# Patient Record
Sex: Male | Born: 1955 | Race: White | Hispanic: No | Marital: Married | Smoking: Never smoker
Health system: Southern US, Community
[De-identification: ages and names within clinical notes are randomized; demographics above are authoritative.]

## PROBLEM LIST (undated history)

## (undated) DIAGNOSIS — E785 Hyperlipidemia, unspecified: Secondary | ICD-10-CM

## (undated) DIAGNOSIS — E7800 Pure hypercholesterolemia, unspecified: Secondary | ICD-10-CM

## (undated) DIAGNOSIS — N319 Neuromuscular dysfunction of bladder, unspecified: Secondary | ICD-10-CM

## (undated) DIAGNOSIS — G8254 Quadriplegia, C5-C7 incomplete: Secondary | ICD-10-CM

## (undated) DIAGNOSIS — N521 Erectile dysfunction due to diseases classified elsewhere: Secondary | ICD-10-CM

## (undated) HISTORY — DX: Quadriplegia, C5-C7 incomplete: G82.54

## (undated) HISTORY — DX: Neuromuscular dysfunction of bladder, unspecified: N31.9

## (undated) HISTORY — PX: BACK SURGERY: SHX140

## (undated) HISTORY — DX: Erectile dysfunction due to diseases classified elsewhere: N52.1

## (undated) HISTORY — PX: LEG SURGERY: SHX1003

## (undated) HISTORY — DX: Hyperlipidemia, unspecified: E78.5

## (undated) HISTORY — DX: Pure hypercholesterolemia, unspecified: E78.00

## (undated) HISTORY — PX: NECK SURGERY: SHX720

## (undated) HISTORY — PX: LUNG REMOVAL, PARTIAL: SHX233

---

## 2001-07-18 DIAGNOSIS — S21332S Puncture wound without foreign body of left front wall of thorax with penetration into thoracic cavity, sequela: Secondary | ICD-10-CM

## 2001-07-18 HISTORY — PX: LUNG REMOVAL, PARTIAL: SHX233

## 2001-07-18 HISTORY — DX: Puncture wound without foreign body of left front wall of thorax with penetration into thoracic cavity, sequela: S21.332S

## 2004-02-06 ENCOUNTER — Encounter: Admission: RE | Admit: 2004-02-06 | Discharge: 2004-02-06 | Payer: Self-pay | Admitting: Family Medicine

## 2005-04-14 ENCOUNTER — Encounter: Admission: RE | Admit: 2005-04-14 | Discharge: 2005-05-27 | Payer: Self-pay | Admitting: Orthopedic Surgery

## 2007-01-18 ENCOUNTER — Ambulatory Visit (HOSPITAL_BASED_OUTPATIENT_CLINIC_OR_DEPARTMENT_OTHER): Admission: RE | Admit: 2007-01-18 | Discharge: 2007-01-18 | Payer: Self-pay | Admitting: Orthopedic Surgery

## 2007-07-19 DIAGNOSIS — G8254 Quadriplegia, C5-C7 incomplete: Secondary | ICD-10-CM

## 2007-07-19 HISTORY — DX: Quadriplegia, C5-C7 incomplete: G82.54

## 2008-02-21 ENCOUNTER — Inpatient Hospital Stay (HOSPITAL_COMMUNITY)
Admission: RE | Admit: 2008-02-21 | Discharge: 2008-03-09 | Payer: Self-pay | Admitting: Physical Medicine & Rehabilitation

## 2008-02-21 ENCOUNTER — Ambulatory Visit: Payer: Self-pay | Admitting: Physical Medicine & Rehabilitation

## 2008-03-09 ENCOUNTER — Inpatient Hospital Stay (HOSPITAL_COMMUNITY): Admission: EM | Admit: 2008-03-09 | Discharge: 2008-03-25 | Payer: Self-pay | Admitting: Internal Medicine

## 2008-03-09 ENCOUNTER — Ambulatory Visit: Payer: Self-pay | Admitting: Internal Medicine

## 2008-03-19 ENCOUNTER — Ambulatory Visit: Payer: Self-pay | Admitting: Infectious Diseases

## 2008-03-19 ENCOUNTER — Encounter (INDEPENDENT_AMBULATORY_CARE_PROVIDER_SITE_OTHER): Payer: Self-pay | Admitting: Internal Medicine

## 2008-03-20 ENCOUNTER — Ambulatory Visit: Payer: Self-pay | Admitting: Vascular Surgery

## 2008-03-20 ENCOUNTER — Encounter: Payer: Self-pay | Admitting: Infectious Diseases

## 2008-03-21 ENCOUNTER — Encounter (INDEPENDENT_AMBULATORY_CARE_PROVIDER_SITE_OTHER): Payer: Self-pay | Admitting: Internal Medicine

## 2008-05-24 ENCOUNTER — Emergency Department (HOSPITAL_COMMUNITY): Admission: EM | Admit: 2008-05-24 | Discharge: 2008-05-24 | Payer: Self-pay | Admitting: Emergency Medicine

## 2008-06-06 ENCOUNTER — Emergency Department (HOSPITAL_COMMUNITY): Admission: EM | Admit: 2008-06-06 | Discharge: 2008-06-06 | Payer: Self-pay | Admitting: Emergency Medicine

## 2008-06-12 ENCOUNTER — Emergency Department (HOSPITAL_COMMUNITY): Admission: EM | Admit: 2008-06-12 | Discharge: 2008-06-13 | Payer: Self-pay | Admitting: Emergency Medicine

## 2008-06-13 ENCOUNTER — Emergency Department (HOSPITAL_COMMUNITY): Admission: EM | Admit: 2008-06-13 | Discharge: 2008-06-13 | Payer: Self-pay | Admitting: Emergency Medicine

## 2008-06-13 ENCOUNTER — Ambulatory Visit (HOSPITAL_COMMUNITY): Admission: RE | Admit: 2008-06-13 | Discharge: 2008-06-13 | Payer: Self-pay | Admitting: Family Medicine

## 2009-01-25 IMAGING — CR DG LUMBAR SPINE COMPLETE 4+V
3 series · 3 of 3 positions shown · non-contrast
Comparison: CT of 03/10/2008

CLINICAL DATA: MRSA.  Rule out infection of hardware.

LUMBAR SPINE - COMPLETE 4+ VIEW

[t l-spine a.p.]
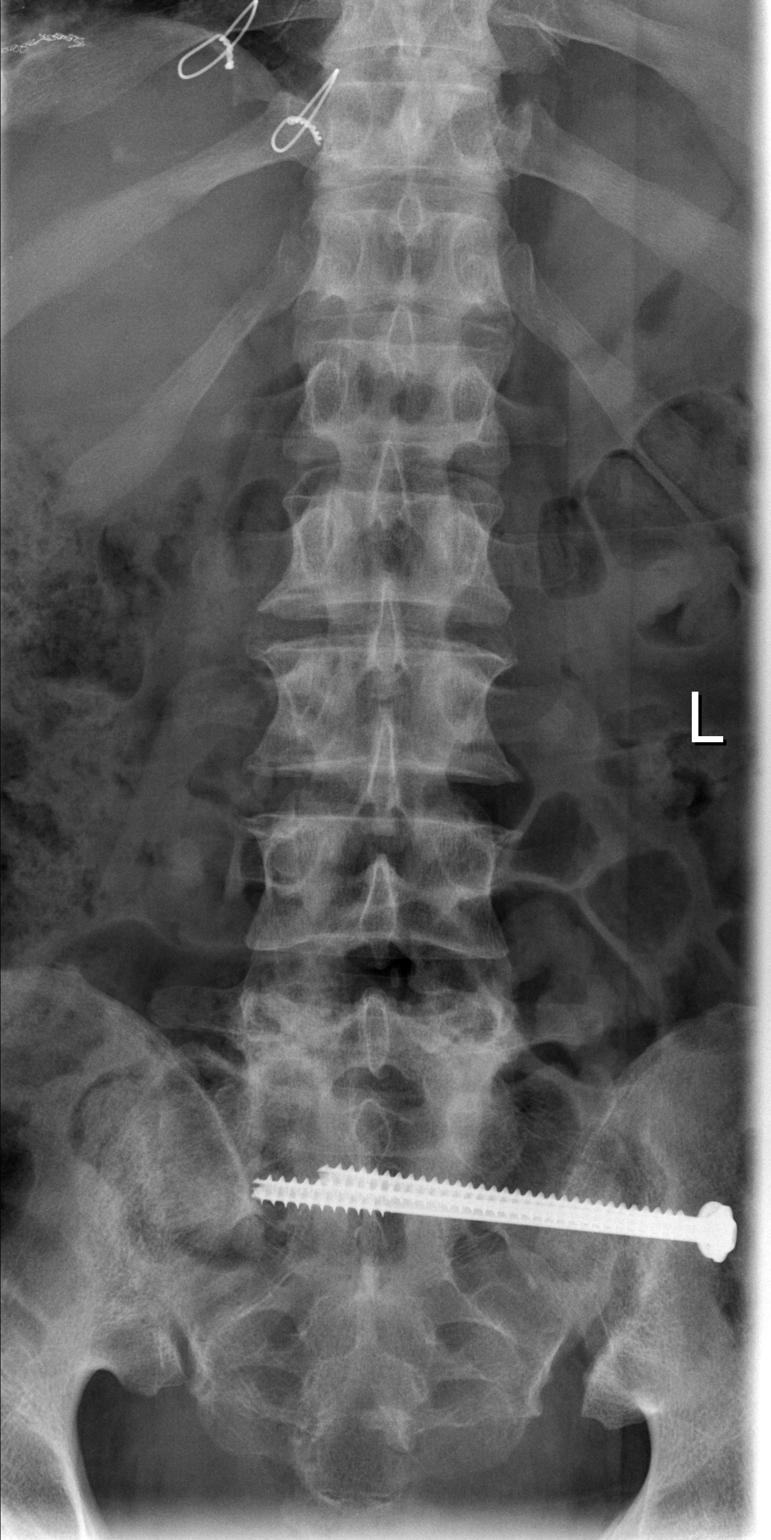

[t l-spine oblique exposure]
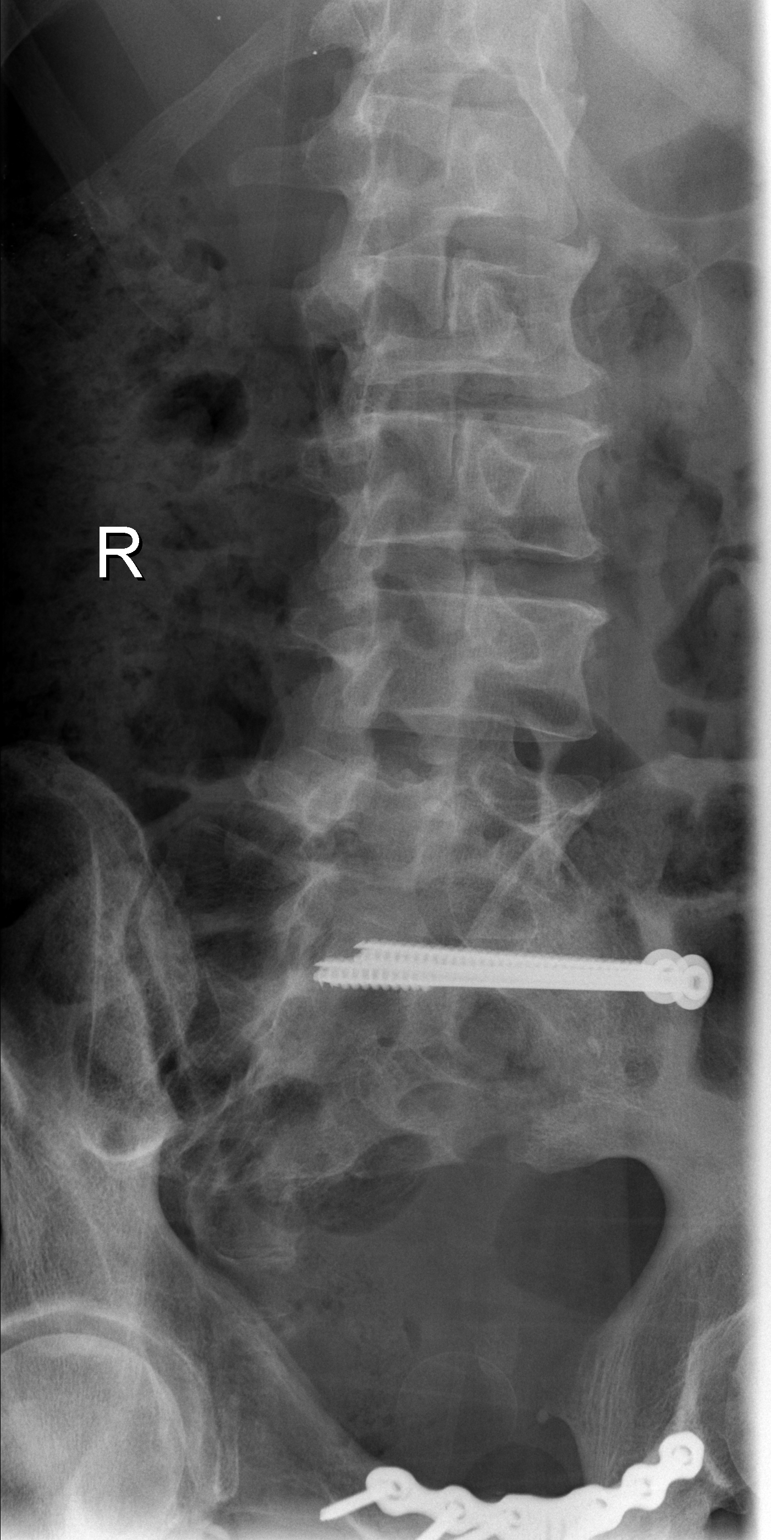

[w x table l-spine]
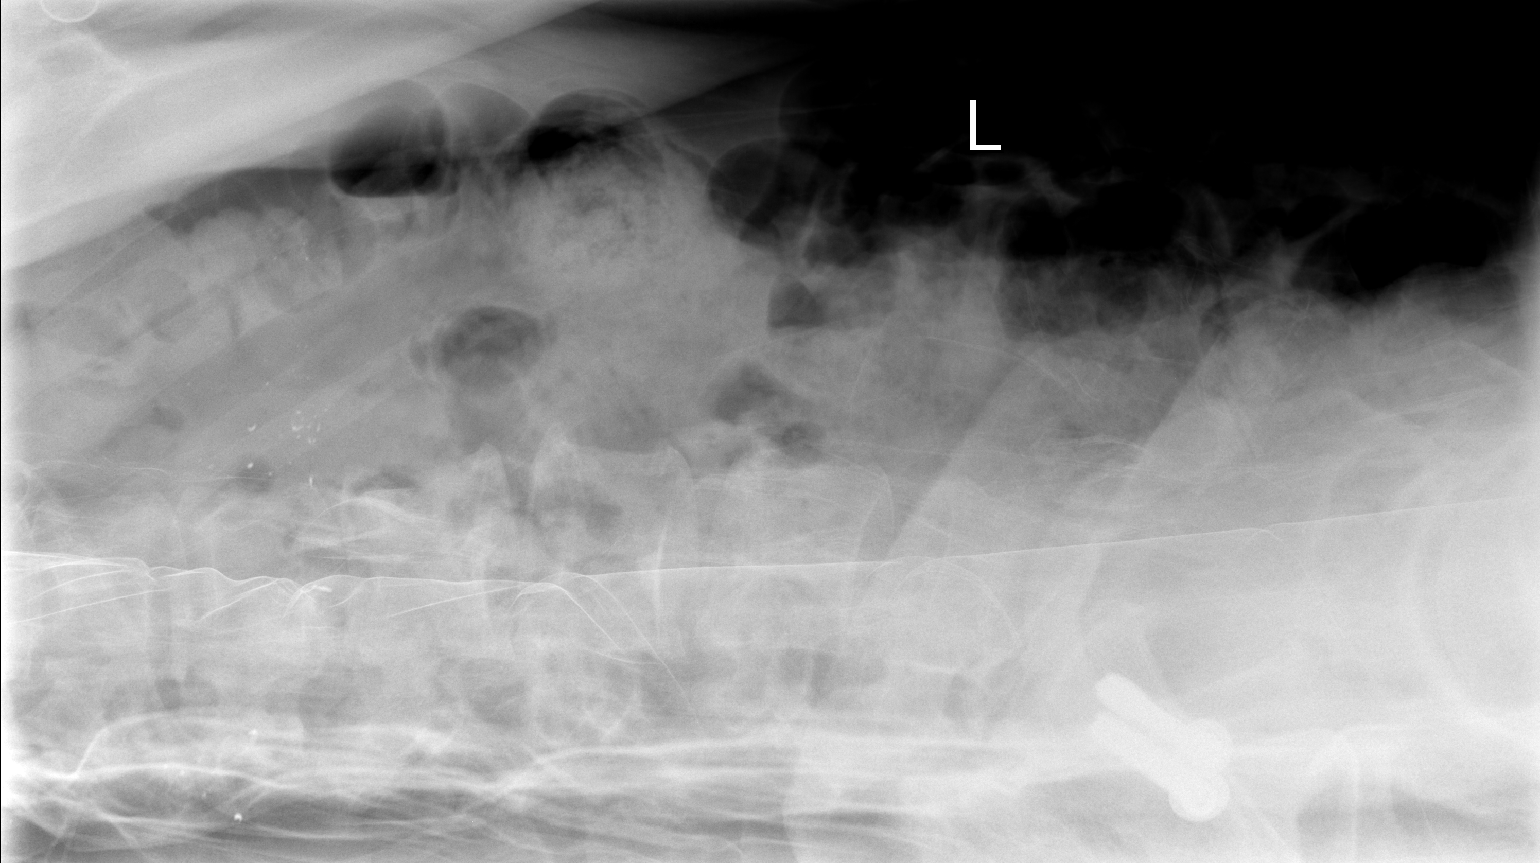

[3 of 3 positions shown; findings below may reference images not displayed]

FINDINGS: The AP view demonstrates screw fixation of the left
sacroiliac joint.  No gross lucency around the screw fixation.
Unremarkable bowel gas pattern. Single oblique view demonstrates
similar findings.  Attempted lateral view is nondiagnostic due to
cross table technique.  Extensive overlying artifact.
IMPRESSION: 1.  Nondiagnostic lateral view.  Frontal and single oblique view
demonstrating prior left sacroiliac joint fixation without acute
hardware complication.

## 2009-01-25 IMAGING — CR DG CERVICAL SPINE 2 OR 3 VIEWS
1 series · 1 of 1 positions shown · non-contrast
Comparison: None

CLINICAL DATA: MRSA.  Rule out osteomyelitis.

CERVICAL SPINE - 2-3 VIEW

[w x table l-spine]
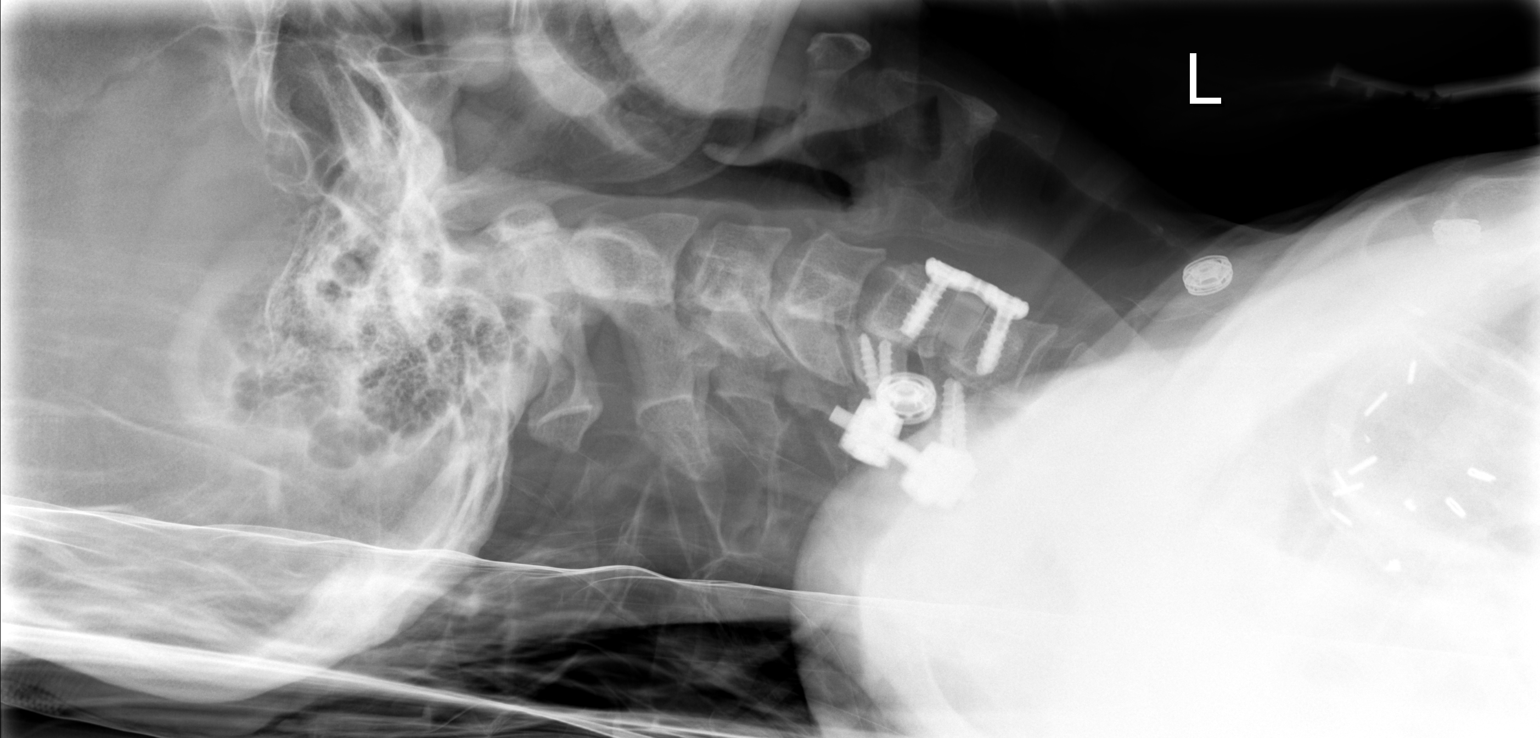

[1 of 1 positions shown; findings below may reference images not displayed]

FINDINGS: Limited 2 view exam.  The lateral view images through
bottom of C6.  Prevertebral soft tissues are normal.  The patient
is status post anterior C5-C6 fixation.  No hardware complication
identified.  Posterior fixation at these levels as well.
IMPRESSION: 1.  Status post C5-C6 fixation without evidence of hardware
complication.
2.  Limited two-view exam without acute abnormality through the C6
level.
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
## 2010-11-30 NOTE — Op Note (Signed)
NAME:  Steven Foster, Steven Foster             ACCOUNT NO.:  [id number]   MEDICAL RECORD NO.:  [id number]          PATIENT TYPE:  AMB   LOCATION:  DSC                          FACILITY:  [facility]   PHYSICIAN:  Katy Fitch. Sypher, M.D. DATE OF BIRTH:  February 18, 1956   DATE OF PROCEDURE:  01/18/2007  DATE OF DISCHARGE:                               OPERATIVE REPORT   PREOPERATIVE DIAGNOSIS:  Status post dog bite left long finger middle  phalangeal and distal interphalangeal segment 12/22/2006 with  development of subsequent cellulitis, septic arthritis and osteomyelitis  of middle phalanx.   POSTOPERATIVE DIAGNOSIS:  Status post dog bite left long finger middle  phalangeal and distal interphalangeal segment 12/22/2006 with  development of subsequent cellulitis, septic arthritis and osteomyelitis  of middle phalanx, confirmation of osteomyelitis of middle phalanx in  the region of the distal metaphysis and radial condyle at the distal  interphalangeal joint and septic arthritis of the distal interphalangeal  joint.   OPERATION:  Wide excision of a chronic granulating wound of left long  finger with curettage of the left long finger middle phalangeal dorsal  cortex for bone culture and debridement of a chronic granulating area of  osteomyelitis adjacent to a distal interphalangeal joint septic  arthritis followed by placement of a through-and-through vessel loop  drain and packing with Xeroflo.   OPERATING SURGEON:  Josephine Igo, M.D.   ASSISTANT:  Molly Maduro Dasnoit PA-C.   ANESTHESIA:  General by LMA, supervising anesthesiologist is Dr.  Sampson Goon.   INDICATIONS:  [name] is a 55 year old gentleman who  sustained a dog bite by his own pet on 12/22/2006.  He initially elected  not to seek medical care for nearly 1 week.  He subsequently was  evaluated at [facility] on [address].  His wound was incised,  cultured and irrigated.  He was started on Septra followed by  combination of  septra and doxycycline.   His wound has continued to granulate, drain and he has developed a  progressive the extensor lag of the left long finger DIP joint with  marked swelling and rubor.  He was ultimately referred for a upper  extremity orthopedic consult.  Clinical examination suggested an  insufficiency of his extensor tendon function and chronic granulation  tissue suggestive of an osteomyelitis deep to the extensor tendon.  Plain x-rays documented an area of lysis in the radial condyle and  radial distal metaphysis of the middle phalanx consistent with a chronic  osteomyelitis and narrowing of the DIP joint suggested chronic septic  arthritis.   We advised Steven Foster to proceed with a thorough debridement of his  finger to obtain both bone and joint cultures as well as to debride his  area of chronic osteomyelitis.   After informed consent he is brought to the operating room at this time.   PROCEDURE:  Steven Foster was brought to operating room and placed in  supine position on the operating table.   Following the induction of general anesthesia by LMA technique, the left  arm was prepped with Betadine soap and solution, sterilely draped.  A  pneumatic tourniquet  was applied proximal brachium.  Following  exsanguination of left arm with Esmarch bandage, arterial tourniquet was  inflated 220 mmHg.  Procedure commenced with a lazy S incision extending  the traumatic wound.  The entire area of granulation tissue was excised  en bloc.  The extensor tendon was gently retracted and elevated off of  the middle phalanx with a Therapist, nutritional.  A large area of chronic  granulation tissue was identified extending into the cortex of the  dorsal radial distal metaphysis of the middle phalanx.  The PIP joint  was entered and granulation tissues present within the joint.   The bone was curetted to a stable margin and all of the new bone  formation from the site of chronic  osteomyelitis and the areas of  cortical lysis were harvested for aerobic and anaerobic cultures.  The  joint was then and debrided with a micro rongeur followed by culture for  aerobic and anaerobic growth.  All visible pathologic tissue was  removed.  There was then irrigated with a dental needle and syringe with  sterile saline.  The joint was then drained with a through-and-through  vessel loop silicone drain followed by packing the wound open with  Xeroflo.  The finger was dressed with sterile gauze and Coban.  There no  apparent complications.   Steven Foster was awakened from his general anesthesia and transferred  recovery room with stable vital  signs.  He is advised to elevator his  hand throughout the weekend and will resume his oral doxycycline  medication.  We will withhold further antibiotics in addition to the  doxycycline until we have culture results.   He is provided prescription for Percocet 5 mg one p.o. for 6 hours and  pain 30 tablets without refill.      Katy Fitch Sypher, M.D.  Electronically Signed     RVS/MEDQ  D:  01/18/2007  T:  01/19/2007  Job:  [id number]
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
## 2011-04-15 LAB — CBC
HCT: 26.8 — ABNORMAL LOW
MCV: 94.1
Platelets: 658 — ABNORMAL HIGH
RBC: 2.84 — ABNORMAL LOW
WBC: 10
WBC: 11.9 — ABNORMAL HIGH

## 2011-04-15 LAB — DIFFERENTIAL
Basophils Absolute: 0
Basophils Relative: 0
Eosinophils Absolute: 0.3
Lymphocytes Relative: 14
Monocytes Absolute: 0.8
Monocytes Relative: 7
Neutrophils Relative %: 76

## 2011-04-15 LAB — CULTURE, BLOOD (ROUTINE X 2): Culture: NO GROWTH

## 2011-04-15 LAB — COMPREHENSIVE METABOLIC PANEL
ALT: 108 — ABNORMAL HIGH
AST: 61 — ABNORMAL HIGH
Albumin: 1.6 — ABNORMAL LOW
BUN: 9
CO2: 28
Calcium: 8.3 — ABNORMAL LOW
GFR calc Af Amer: >60
Glucose, Bld: 91
Potassium: 3.9
Sodium: 137
Total Protein: 5.5 — ABNORMAL LOW

## 2011-04-15 LAB — URINALYSIS, ROUTINE W REFLEX MICROSCOPIC
Ketones, ur: NEGATIVE
Nitrite: NEGATIVE
pH: 6.5

## 2011-04-15 LAB — URINE CULTURE: Special Requests: NEGATIVE

## 2011-04-15 LAB — URINE MICROSCOPIC-ADD ON

## 2011-04-19 LAB — URINALYSIS, ROUTINE W REFLEX MICROSCOPIC
Bilirubin Urine: NEGATIVE
Bilirubin Urine: NEGATIVE
Glucose, UA: NEGATIVE
Glucose, UA: NEGATIVE
Glucose, UA: NEGATIVE
Ketones, ur: 15 — AB
Ketones, ur: NEGATIVE
Protein, ur: 100 — AB
Specific Gravity, Urine: 1.012
Urobilinogen, UA: 0.2
pH: 7

## 2011-04-19 LAB — URINE CULTURE
Colony Count: >=100000
Culture: NO GROWTH

## 2011-04-19 LAB — URINE MICROSCOPIC-ADD ON

## 2011-04-19 LAB — POCT I-STAT, CHEM 8
BUN: 13
Calcium, Ion: 1.34 — ABNORMAL HIGH
Creatinine, Ser: 1
TCO2: 26

## 2011-04-20 LAB — CBC
HCT: 29.1 — ABNORMAL LOW
HCT: 29.4 — ABNORMAL LOW
HCT: 29.7 — ABNORMAL LOW
HCT: 29.9 — ABNORMAL LOW
HCT: 31.2 — ABNORMAL LOW
Hemoglobin: 10.5 — ABNORMAL LOW
Hemoglobin: 9.7 — ABNORMAL LOW
Hemoglobin: 9.8 — ABNORMAL LOW
Hemoglobin: 9.9 — ABNORMAL LOW
Hemoglobin: 9.9 — ABNORMAL LOW
MCHC: 33
MCHC: 33.4
MCHC: 34.1
MCHC: 34.2
MCV: 89.5
MCV: 90.4
MCV: 90.6
MCV: 91
Platelets: 540 — ABNORMAL HIGH
Platelets: 541 — ABNORMAL HIGH
Platelets: 596 — ABNORMAL HIGH
RBC: 3.2 — ABNORMAL LOW
RBC: 3.22 — ABNORMAL LOW
RBC: 3.25 — ABNORMAL LOW
RBC: 3.27 — ABNORMAL LOW
RBC: 3.34 — ABNORMAL LOW
RBC: 3.34 — ABNORMAL LOW
RBC: 3.43 — ABNORMAL LOW
RDW: 17.1 — ABNORMAL HIGH
RDW: 17.2 — ABNORMAL HIGH
RDW: 17.6 — ABNORMAL HIGH
RDW: 18.3 — ABNORMAL HIGH
WBC: 11 — ABNORMAL HIGH
WBC: 12.9 — ABNORMAL HIGH
WBC: 13.4 — ABNORMAL HIGH
WBC: 14.4 — ABNORMAL HIGH
WBC: 8.8

## 2011-04-20 LAB — URINALYSIS, ROUTINE W REFLEX MICROSCOPIC
Bilirubin Urine: NEGATIVE
Glucose, UA: NEGATIVE
Ketones, ur: NEGATIVE
Specific Gravity, Urine: 1.008
pH: 7

## 2011-04-20 LAB — DIFFERENTIAL
Basophils Absolute: 0.1
Basophils Relative: 1
Lymphocytes Relative: 11 — ABNORMAL LOW
Monocytes Absolute: 1.3 — ABNORMAL HIGH
Neutro Abs: 10.1 — ABNORMAL HIGH
Neutrophils Relative %: 75

## 2011-04-20 LAB — BASIC METABOLIC PANEL
BUN: 11
BUN: 7
Calcium: 8.6
Chloride: 100
Chloride: 102
Creatinine, Ser: 0.66
GFR calc Af Amer: >60
GFR calc non Af Amer: >60
GFR calc non Af Amer: >60
Potassium: 3.8
Sodium: 135

## 2011-04-20 LAB — CULTURE, BLOOD (ROUTINE X 2): Culture: NO GROWTH

## 2011-04-20 LAB — COMPREHENSIVE METABOLIC PANEL
AST: 23
BUN: 8
CO2: 28
Calcium: 8.7
Creatinine, Ser: 0.58
GFR calc Af Amer: >60
GFR calc non Af Amer: >60
Total Bilirubin: 0.6

## 2011-04-20 LAB — URINE MICROSCOPIC-ADD ON

## 2011-05-03 LAB — CULTURE, ROUTINE-ABSCESS

## 2011-05-03 LAB — ANAEROBIC CULTURE

## 2011-05-03 LAB — POCT HEMOGLOBIN-HEMACUE: Operator id: 12362

## 2012-02-23 ENCOUNTER — Emergency Department (HOSPITAL_COMMUNITY)
Admission: EM | Admit: 2012-02-23 | Discharge: 2012-02-23 | Disposition: A | Discharge status: Home / Self Care | Payer: Medicare Other | Attending: Emergency Medicine | Admitting: Emergency Medicine

## 2012-02-23 ENCOUNTER — Encounter (HOSPITAL_COMMUNITY): Payer: Self-pay | Admitting: Adult Health

## 2012-02-23 DIAGNOSIS — Y998 Other external cause status: Secondary | ICD-10-CM | POA: Insufficient documentation

## 2012-02-23 DIAGNOSIS — S058X9A Other injuries of unspecified eye and orbit, initial encounter: Secondary | ICD-10-CM | POA: Insufficient documentation

## 2012-02-23 DIAGNOSIS — S0501XA Injury of conjunctiva and corneal abrasion without foreign body, right eye, initial encounter: Secondary | ICD-10-CM

## 2012-02-23 DIAGNOSIS — Y9389 Activity, other specified: Secondary | ICD-10-CM | POA: Insufficient documentation

## 2012-02-23 DIAGNOSIS — T1590XA Foreign body on external eye, part unspecified, unspecified eye, initial encounter: Secondary | ICD-10-CM | POA: Insufficient documentation

## 2012-02-23 MED ORDER — ERYTHROMYCIN 5 MG/GM OP OINT
TOPICAL_OINTMENT | Freq: Once | OPHTHALMIC | Status: AC
  Administered 2012-02-23: 23:00:00 via OPHTHALMIC
  Filled 2012-02-23: qty 1

## 2012-02-23 MED ORDER — PROPARACAINE HCL 0.5 % OP SOLN
2.0000 [drp] | OPHTHALMIC | Status: AC
  Administered 2012-02-23: 2 [drp] via OPHTHALMIC
  Filled 2012-02-23: qty 15

## 2012-02-23 MED ORDER — TETRACAINE HCL 0.5 % OP SOLN
1.0000 [drp] | Freq: Once | OPHTHALMIC | Status: AC
  Administered 2012-02-23: 1 [drp] via OPHTHALMIC

## 2012-02-23 MED ORDER — TETRACAINE HCL 0.5 % OP SOLN
OPHTHALMIC | Status: AC
  Filled 2012-02-23: qty 2

## 2012-02-23 MED ORDER — FLUORESCEIN SODIUM 1 MG OP STRP
1.0000 | ORAL_STRIP | Freq: Once | OPHTHALMIC | Status: AC
  Administered 2012-02-23: 1 via OPHTHALMIC

## 2012-02-23 MED ORDER — TETANUS-DIPHTHERIA TOXOIDS TD 5-2 LFU IM INJ
0.5000 mL | INJECTION | Freq: Once | INTRAMUSCULAR | Status: AC
  Administered 2012-02-23: 0.5 mL via INTRAMUSCULAR
  Filled 2012-02-23 (×2): qty 0.5

## 2012-02-23 MED ORDER — FLUORESCEIN SODIUM 1 MG OP STRP
ORAL_STRIP | OPHTHALMIC | Status: AC
  Filled 2012-02-23: qty 2

## 2012-02-23 NOTE — ED Provider Notes (Signed)
Medical screening examination/treatment/procedure(s) were performed by non-physician practitioner and as supervising physician I was immediately available for consultation/collaboration.   Glynn Octave, MD 02/23/12 [phone]

## 2012-02-23 NOTE — ED Notes (Signed)
Reports wood in eye while doing a craft today at 1630. Right eye with redness. Eye was rinsed out by family. Pt denies blurred vision and states he can see normally.

## 2012-02-23 NOTE — ED Provider Notes (Signed)
History     CSN: [id number]  Arrival date & time 02/23/12  2050   First MD Initiated Contact with Patient 02/23/12 2111      Chief Complaint  Patient presents with  . Eye Pain    (Consider location/radiation/quality/duration/timing/severity/associated sxs/prior treatment) HPI Comments: Patient was working with wood today, sawing any inadvertently got sawdust in his eyes as he was not working with his safety glasses.  He now has tearing, and pain.  Slight redness to his right eye.  His wife states, that he flushed his eye out with water.  At home before coming in.  He does not report any visual changes  Patient is a 56 y.o. male presenting with eye pain. The history is provided by the patient.  Eye Pain This is a new problem. The current episode started today. Pertinent negatives include no chills, fever, headaches or nausea.    History reviewed. No pertinent past medical history.  Past Surgical History  Procedure Date  . Lung removal, partial     History reviewed. No pertinent family history.  History  Substance Use Topics  . Smoking status: Never Smoker   . Smokeless tobacco: Not on file  . Alcohol Use: No      Review of Systems  Constitutional: Negative for fever and chills.  HENT: Negative for rhinorrhea.   Eyes: Positive for photophobia, pain and redness. Negative for visual disturbance.  Gastrointestinal: Negative for nausea.  Skin: Negative for wound.  Neurological: Negative for dizziness and headaches.    Allergies  Penicillins  Home Medications   Current Outpatient Rx  Name Route Sig Dispense Refill  . BACLOFEN 10 MG PO TABS Oral Take 10 mg by mouth 2 (two) times daily.    Marland Kitchen DIAZEPAM 5 MG PO TABS Oral Take 5 mg by mouth every 6 (six) hours as needed. For sleep and muscle spasm    . ADULT MULTIVITAMIN W/MINERALS CH Oral Take 1 tablet by mouth daily.    Marland Kitchen NAPROXEN 250 MG PO TABS Oral Take 250 mg by mouth 2 (two) times daily as needed. For pain    .  SIMVASTATIN 40 MG PO TABS Oral Take 40 mg by mouth every evening.      BP 123/84  Pulse 70  Temp 98.3 F (36.8 C) (Oral)  Resp 14  SpO2 98%  Physical Exam  Constitutional: He appears well-developed.  HENT:  Head: Normocephalic.  Eyes: Pupils are equal, round, and reactive to light.  Slit lamp exam:      The left eye shows corneal abrasion and foreign body.       Small particles of sawdust visualized in the medial, lower, inner canthus.  I will have the site flushed, with 500 cc of normal saline before the application of erythromycin ointment for his corneal abrasion  Neck: Normal range of motion.  Cardiovascular: Normal rate.   Pulmonary/Chest: Effort normal.  Musculoskeletal: Normal range of motion.  Skin: Skin is warm.    ED Course  Procedures (including critical care time)  Labs Reviewed - No data to display No results found.   1. Corneal abrasion, right   2. Foreign body in eye       MDM   I was flushed, with 500 cc of normal saline foreign bodies removed he does have a small corneal abrasion at approximately 3:00 location.  I have given, this patient.  Erythromycin ointment to use 4 times a day, and a referral to the ophthalmologist.  He is to  call them first thing in the morning to set an evaluation.  Time        Arman Filter, NP 02/23/12 2228  Arman Filter, NP 02/23/12 2230

## 2012-02-23 NOTE — Discharge Instructions (Signed)
Corneal Abrasion The cornea is the clear covering at the front and center of the eye. It is a thin tissue made up of layers. The top layer is the most sensitive layer. A corneal abrasion happens if this layer is scratched or an injury causes it to come off.  HOME CARE  You may be given drops or a medicated cream. Use the medicine as told by your doctor.   A pressure patch may be put over the eye. If this is done, follow your doctor's instructions for when to remove the patch. Do not drive or use machines while the eye patch is on. Judging distances is hard to do with a patch on.   See your doctor for a follow-up exam if you are told to do so.  GET HELP RIGHT AWAY IF:   The pain is getting worse or is very bad.   The eye is very sensitive to light.   Any liquid comes out of the injured eye after treatment.   Your vision suddenly gets worse.   You have a sudden loss of vision or blindness.  MAKE SURE YOU:   Understand these instructions.   Will watch your condition.   Will get help right away if you are not doing well or get worse.  Document Released: 12/21/2007 Document Revised: 06/23/2011 Document Reviewed: 12/21/2007 [facility] Patient Information 2012 [place], [place]. Please uses a the supplied I ointment as follows one quarter in stripe inside the lower lid 4 times a day.  Please call the ophthalmologist in the morning to set an evaluation time for followup

## 2014-04-11 ENCOUNTER — Encounter (HOSPITAL_COMMUNITY): Payer: Self-pay | Admitting: Emergency Medicine

## 2014-04-11 ENCOUNTER — Emergency Department (HOSPITAL_COMMUNITY)
Admission: EM | Admit: 2014-04-11 | Discharge: 2014-04-11 | Disposition: A | Discharge status: Home / Self Care | Payer: Medicare Other | Attending: Emergency Medicine | Admitting: Emergency Medicine

## 2014-04-11 DIAGNOSIS — Z880 Allergy status to penicillin: Secondary | ICD-10-CM | POA: Diagnosis not present

## 2014-04-11 DIAGNOSIS — R58 Hemorrhage, not elsewhere classified: Secondary | ICD-10-CM | POA: Insufficient documentation

## 2014-04-11 DIAGNOSIS — Z79899 Other long term (current) drug therapy: Secondary | ICD-10-CM | POA: Diagnosis not present

## 2014-04-11 DIAGNOSIS — S81801A Unspecified open wound, right lower leg, initial encounter: Secondary | ICD-10-CM

## 2014-04-11 NOTE — ED Notes (Signed)
Pressure bandage had the  Bleeding controlled until i unwrapped it.  He has a small area over a varicose vein that iis bleeding still not spurting.  Pressure bandage re applied.  Watch for bleeding through

## 2014-04-11 NOTE — ED Notes (Signed)
The pt weas scratching his rt lower leg when he scratched a scab off and there was  A lot of blood according to his wife.  Bleeding controlled at present.  He has varicose veins.  Takes no blood thinners

## 2014-04-11 NOTE — Discharge Instructions (Signed)
Keep compressive dressing on for 24 hours. If bleeding recurs re-dress the wound. If you were given medicines take as directed.  If you are on coumadin or contraceptives realize their levels and effectiveness is altered by many different medicines.  If you have any reaction (rash, tongues swelling, other) to the medicines stop taking and see a physician.   Please follow up as directed and return to the ER or see a physician for new or worsening symptoms.  Thank you. Filed Vitals:   04/11/14 0016 04/11/14 0155 04/11/14 0200 04/11/14 0215  BP: 139/77 149/94 127/79 148/79  Pulse: 59 61 59 60  Temp: 98.2 F (36.8 C) 97.6 F (36.4 C)    TempSrc: Oral Oral    Resp: 22 18  14   Height: 5\' 8"  (1.727 m)     Weight: 176 lb (79.833 kg)     SpO2: 98% 98% 98% 97%

## 2014-04-11 NOTE — ED Provider Notes (Signed)
CSN: [id number]     Arrival date & time 04/11/14  0004 History   First MD Initiated Contact with Patient 04/11/14 0143     Chief Complaint  Patient presents with  . bleedikng from rt lower leg      (Consider location/radiation/quality/duration/timing/severity/associated sxs/prior Treatment) HPI Comments: 58 year old male with no significant medical history except for cholesterol presents with right lateral lower leg bleeding since scratching it this evening. Patient had small pool of blood besides light. No syncope or lightheadedness. No blood thinners. No injuries.   History reviewed. No pertinent past medical history. Past Surgical History  Procedure Laterality Date  . Lung removal, partial     No family history on file. History  Substance Use Topics  . Smoking status: Never Smoker   . Smokeless tobacco: Not on file  . Alcohol Use: No    Review of Systems  Constitutional: Negative for fever and chills.  Gastrointestinal: Negative for vomiting and abdominal pain.  Genitourinary: Negative for dysuria and flank pain.  Musculoskeletal: Negative for back pain, neck pain and neck stiffness.  Skin: Positive for wound. Negative for rash.  Neurological: Negative for light-headedness and headaches.      Allergies  Penicillins  Home Medications   Prior to Admission medications   Medication Sig Start Date End Date Taking? Authorizing Provider  baclofen (LIORESAL) 10 MG tablet Take 10 mg by mouth 2 (two) times daily.   Yes Historical Provider, MD  Multiple Vitamin (MULTIVITAMIN WITH MINERALS) TABS Take 1 tablet by mouth daily.   Yes Historical Provider, MD  Omega-3 Fatty Acids (FISH OIL PO) Take 1 capsule by mouth daily.   Yes Historical Provider, MD  simvastatin (ZOCOR) 40 MG tablet Take 40 mg by mouth every evening.   Yes Historical Provider, MD   BP 148/79  Pulse 60  Temp(Src) 97.6 F (36.4 C) (Oral)  Resp 14  Ht 5\' 8"  (1.727 m)  Wt 176 lb (79.833 kg)  BMI 26.77 kg/m2   SpO2 97% Physical Exam  Nursing note and vitals reviewed. Constitutional: He appears well-developed and well-nourished. No distress.  HENT:  Head: Normocephalic and atraumatic.  Eyes: Conjunctivae are normal.  Pulmonary/Chest: Effort normal.  Musculoskeletal: He exhibits no edema and no tenderness.  Neurological: He is alert.  Skin: Skin is warm.  Right lower lateral leg, dressing removed and patient had 2 mm site of previous bleeding which is currently controlled. No sign of infection. No pulsatile bleeding. Neurovascularly intact right leg  Psychiatric: He has a normal mood and affect.    ED Course  Procedures (including critical care time) Labs Review Labs Reviewed - No data to display  Imaging Review No results found.   EKG Interpretation None      MDM   Final diagnoses:  Leg wound, right, initial encounter   bleeding varicose vein  Well-appearing patient with bleeding controlled in ER. Compressive dressing with a quick clot bandage placed. Supportive care discussed.  No bleeding in ER.  Results and differential diagnosis were discussed with the patient/parent/guardian. Close follow up outpatient was discussed, comfortable with the plan.   Medications - No data to display  Filed Vitals:   04/11/14 0016 04/11/14 0155 04/11/14 0200 04/11/14 0215  BP: 139/77 149/94 127/79 148/79  Pulse: 59 61 59 60  Temp: 98.2 F (36.8 C) 97.6 F (36.4 C)    TempSrc: Oral Oral    Resp: 22 18  14   Height: 5\' 8"  (1.727 m)     Weight: 176 lb (  79.833 kg)     SpO2: 98% 98% 98% 97%    Final diagnoses:  Leg wound, right, initial encounter        Mariea Clonts, MD 04/11/14 [phone]

## 2014-04-11 NOTE — ED Notes (Signed)
Dr. Reather Converse at bedside. Applied quick clot and applied new dressing.

## 2014-11-27 ENCOUNTER — Ambulatory Visit (INDEPENDENT_AMBULATORY_CARE_PROVIDER_SITE_OTHER): Payer: Medicare Other | Admitting: Neurology

## 2014-11-27 ENCOUNTER — Encounter: Payer: Self-pay | Admitting: Neurology

## 2014-11-27 VITALS — BP 122/76 | HR 72 | Resp 18 | Ht 68.0 in | Wt 176.0 lb

## 2014-11-27 DIAGNOSIS — S14109S Unspecified injury at unspecified level of cervical spinal cord, sequela: Secondary | ICD-10-CM

## 2014-11-27 DIAGNOSIS — G8250 Quadriplegia, unspecified: Secondary | ICD-10-CM

## 2014-11-27 DIAGNOSIS — G8389 Other specified paralytic syndromes: Secondary | ICD-10-CM | POA: Diagnosis not present

## 2014-11-27 DIAGNOSIS — M62838 Other muscle spasm: Secondary | ICD-10-CM | POA: Diagnosis not present

## 2014-11-27 DIAGNOSIS — S14105S Unspecified injury at C5 level of cervical spinal cord, sequela: Secondary | ICD-10-CM

## 2014-11-27 MED ORDER — BACLOFEN 10 MG PO TABS: 20.0000 mg | ORAL_TABLET | Freq: Four times a day (QID) | ORAL | Status: DC
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
## 2015-03-19 ENCOUNTER — Telehealth: Payer: Self-pay

## 2015-03-19 NOTE — Telephone Encounter (Signed)
I spoke to patient and I asked if he could move his appt to a later time due to template change. Patient was able to move time to 1:30.

## 2015-03-30 ENCOUNTER — Ambulatory Visit (INDEPENDENT_AMBULATORY_CARE_PROVIDER_SITE_OTHER): Payer: Medicare Other | Admitting: Neurology

## 2015-03-30 ENCOUNTER — Ambulatory Visit: Payer: Medicare Other | Admitting: Neurology

## 2015-03-30 ENCOUNTER — Encounter: Payer: Self-pay | Admitting: Neurology

## 2015-03-30 VITALS — BP 102/68 | HR 62 | Resp 16 | Ht 68.0 in | Wt 178.0 lb

## 2015-03-30 DIAGNOSIS — S14105S Unspecified injury at C5 level of cervical spinal cord, sequela: Secondary | ICD-10-CM

## 2015-03-30 DIAGNOSIS — S14109S Unspecified injury at unspecified level of cervical spinal cord, sequela: Secondary | ICD-10-CM | POA: Diagnosis not present

## 2015-03-30 DIAGNOSIS — G8389 Other specified paralytic syndromes: Secondary | ICD-10-CM

## 2015-03-30 DIAGNOSIS — G8250 Quadriplegia, unspecified: Secondary | ICD-10-CM

## 2015-03-30 MED ORDER — BACLOFEN 20 MG PO TABS: 20.0000 mg | ORAL_TABLET | Freq: Four times a day (QID) | ORAL | Status: DC
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
## 2015-03-30 NOTE — Patient Instructions (Addendum)
We will continue with Baclofen 20 mg 4 times a day and I changed the pills to 20 mg strength, so you will take 1 pill 4 times a day!  Try to stay active. Try to drink enough water.   If it has been more than 3 years since your eye check, please have your eyes checked routine.   Monitor your left leg swelling.   Follow up in 6 months.

## 2015-06-08 ENCOUNTER — Other Ambulatory Visit: Payer: Self-pay | Admitting: Family Medicine

## 2015-06-08 ENCOUNTER — Ambulatory Visit
Admission: RE | Admit: 2015-06-08 | Discharge: 2015-06-08 | Disposition: A | Discharge status: Home / Self Care | Payer: Medicare Other | Source: Ambulatory Visit | Attending: Family Medicine | Admitting: Family Medicine

## 2015-06-08 DIAGNOSIS — M79662 Pain in left lower leg: Secondary | ICD-10-CM

## 2015-09-08 DIAGNOSIS — F322 Major depressive disorder, single episode, severe without psychotic features: Secondary | ICD-10-CM | POA: Diagnosis not present

## 2015-09-28 ENCOUNTER — Telehealth: Payer: Self-pay

## 2015-09-28 ENCOUNTER — Encounter: Payer: Self-pay | Admitting: Nurse Practitioner

## 2015-09-28 ENCOUNTER — Ambulatory Visit (INDEPENDENT_AMBULATORY_CARE_PROVIDER_SITE_OTHER): Payer: Commercial Managed Care - HMO | Admitting: Nurse Practitioner

## 2015-09-28 VITALS — BP 112/75 | HR 62 | Ht 68.0 in | Wt 181.6 lb

## 2015-09-28 DIAGNOSIS — S14109S Unspecified injury at unspecified level of cervical spinal cord, sequela: Secondary | ICD-10-CM | POA: Insufficient documentation

## 2015-09-28 DIAGNOSIS — G8389 Other specified paralytic syndromes: Secondary | ICD-10-CM | POA: Diagnosis not present

## 2015-09-28 DIAGNOSIS — IMO0002 Reserved for concepts with insufficient information to code with codable children: Secondary | ICD-10-CM | POA: Insufficient documentation

## 2015-09-28 NOTE — Patient Instructions (Signed)
Continue baclofen at current dose does not need refills Follow-up in 6 months

## 2015-09-28 NOTE — Telephone Encounter (Signed)
Patient did not show to appt today  
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
## 2015-09-29 ENCOUNTER — Ambulatory Visit: Payer: Commercial Managed Care - HMO | Admitting: Neurology

## 2015-10-06 DIAGNOSIS — F322 Major depressive disorder, single episode, severe without psychotic features: Secondary | ICD-10-CM | POA: Diagnosis not present

## 2015-10-15 DIAGNOSIS — B349 Viral infection, unspecified: Secondary | ICD-10-CM | POA: Diagnosis not present

## 2016-01-13 DIAGNOSIS — N3020 Other chronic cystitis without hematuria: Secondary | ICD-10-CM | POA: Diagnosis not present

## 2016-01-13 DIAGNOSIS — N3941 Urge incontinence: Secondary | ICD-10-CM | POA: Diagnosis not present

## 2016-01-13 DIAGNOSIS — N3080 Other cystitis without hematuria: Secondary | ICD-10-CM | POA: Diagnosis not present

## 2016-01-13 DIAGNOSIS — R339 Retention of urine, unspecified: Secondary | ICD-10-CM | POA: Diagnosis not present

## 2016-01-13 DIAGNOSIS — N5201 Erectile dysfunction due to arterial insufficiency: Secondary | ICD-10-CM | POA: Diagnosis not present

## 2016-01-13 DIAGNOSIS — G8250 Quadriplegia, unspecified: Secondary | ICD-10-CM | POA: Diagnosis not present

## 2016-02-08 DIAGNOSIS — R42 Dizziness and giddiness: Secondary | ICD-10-CM | POA: Diagnosis not present

## 2016-02-23 ENCOUNTER — Encounter: Payer: Self-pay | Admitting: Neurology

## 2016-03-28 ENCOUNTER — Ambulatory Visit: Payer: Commercial Managed Care - HMO | Admitting: Neurology

## 2016-04-05 ENCOUNTER — Ambulatory Visit: Payer: Commercial Managed Care - HMO | Admitting: Neurology

## 2016-05-03 ENCOUNTER — Other Ambulatory Visit: Payer: Self-pay | Admitting: Neurology

## 2016-05-03 DIAGNOSIS — S14105S Unspecified injury at C5 level of cervical spinal cord, sequela: Secondary | ICD-10-CM

## 2016-05-03 DIAGNOSIS — G8250 Quadriplegia, unspecified: Secondary | ICD-10-CM

## 2016-06-13 ENCOUNTER — Encounter: Payer: Self-pay | Admitting: Neurology

## 2016-06-13 ENCOUNTER — Ambulatory Visit (INDEPENDENT_AMBULATORY_CARE_PROVIDER_SITE_OTHER): Payer: Medicare Other | Admitting: Neurology

## 2016-06-13 DIAGNOSIS — G8250 Quadriplegia, unspecified: Secondary | ICD-10-CM | POA: Diagnosis not present

## 2016-06-13 DIAGNOSIS — S14105S Unspecified injury at C5 level of cervical spinal cord, sequela: Secondary | ICD-10-CM | POA: Diagnosis not present

## 2016-06-13 MED ORDER — BACLOFEN 20 MG PO TABS: 20.0000 mg | ORAL_TABLET | Freq: Four times a day (QID) | ORAL | 3 refills | Status: DC
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
## 2016-07-22 ENCOUNTER — Other Ambulatory Visit: Payer: Self-pay | Admitting: Neurology

## 2016-07-22 NOTE — Telephone Encounter (Signed)
Pt requesting refill request for baclofen (LIORESAL) 20 MG tablet.

## 2016-07-22 NOTE — Telephone Encounter (Signed)
Please advise pt, that 90 day Rx was just done about a month ago to [facility].

## 2016-07-25 NOTE — Telephone Encounter (Signed)
I spoke to Steven Foster, he was not aware that it was sent in. I advised him of which pharmacy and asked him to call back if he had any further trouble.

## 2016-09-02 ENCOUNTER — Other Ambulatory Visit: Payer: Self-pay | Admitting: Family Medicine

## 2016-09-02 DIAGNOSIS — R221 Localized swelling, mass and lump, neck: Secondary | ICD-10-CM

## 2016-09-05 ENCOUNTER — Ambulatory Visit
Admission: RE | Admit: 2016-09-05 | Discharge: 2016-09-05 | Disposition: A | Discharge status: Home / Self Care | Payer: Medicare Other | Source: Ambulatory Visit | Attending: Family Medicine | Admitting: Family Medicine

## 2016-09-05 DIAGNOSIS — R221 Localized swelling, mass and lump, neck: Secondary | ICD-10-CM

## 2017-06-08 ENCOUNTER — Other Ambulatory Visit: Payer: Self-pay

## 2017-06-08 ENCOUNTER — Emergency Department (HOSPITAL_COMMUNITY)
Admission: EM | Admit: 2017-06-08 | Discharge: 2017-06-08 | Disposition: A | Discharge status: Home / Self Care | Payer: Medicare Other | Attending: Emergency Medicine | Admitting: Emergency Medicine

## 2017-06-08 ENCOUNTER — Encounter (HOSPITAL_COMMUNITY): Payer: Self-pay | Admitting: Emergency Medicine

## 2017-06-08 DIAGNOSIS — Z79899 Other long term (current) drug therapy: Secondary | ICD-10-CM | POA: Diagnosis not present

## 2017-06-08 DIAGNOSIS — R310 Gross hematuria: Secondary | ICD-10-CM | POA: Diagnosis present

## 2017-06-08 DIAGNOSIS — G8253 Quadriplegia, C5-C7 complete: Secondary | ICD-10-CM | POA: Diagnosis not present

## 2017-06-08 DIAGNOSIS — N3091 Cystitis, unspecified with hematuria: Secondary | ICD-10-CM | POA: Insufficient documentation

## 2017-06-08 LAB — URINALYSIS, ROUTINE W REFLEX MICROSCOPIC: SQUAMOUS EPITHELIAL / LPF: NONE SEEN

## 2017-06-08 MED ORDER — CEPHALEXIN 250 MG PO CAPS
500.0000 mg | ORAL_CAPSULE | Freq: Once | ORAL | Status: AC
  Administered 2017-06-08: 500 mg via ORAL
  Filled 2017-06-08: qty 2

## 2017-06-08 MED ORDER — CEPHALEXIN 500 MG PO CAPS: 500.0000 mg | ORAL_CAPSULE | Freq: Four times a day (QID) | ORAL | 0 refills | Status: DC

## 2017-06-08 NOTE — ED Provider Notes (Signed)
[facility] Provider Note   CSN: [id number] Arrival date & time: 06/08/17  2542     History   Chief Complaint Chief Complaint  Patient presents with  . Hematuria    HPI Steven Foster is a 61 y.o. male.  Patient indicates tried to self cath/in and out this AM, and then noted blood in urine. At baseline he states most of the time he urinates on own, but that occasionally if he has trouble he will self cath. Denies any recent dysuria.  Denies abdominal or flank pain. No fever or chills. No odor to urine. States felt fine/at baseline before trying to cath and then noting the blood. No other abnormal bleeding or bruising. No anticoag use.   The history is provided by the patient.  Hematuria  Pertinent negatives include no chest pain, no abdominal pain, no headaches and no shortness of breath.    Past Medical History:  Diagnosis Date  . Erectile dysfunction due to diseases classified elsewhere   . Hypercholesteremia   . Neuromuscular dysfunction of bladder   . Quadriplegia, C5-C7, incomplete [facility])     Patient Active Problem List   Diagnosis Date Noted  . 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 Unspecified injury at unspecified level of cervical spinal cord, sequela ([place]) 09/28/2015    Past Surgical History:  Procedure Laterality Date  . BACK SURGERY    . LEG SURGERY    . LUNG REMOVAL, PARTIAL    . NECK SURGERY         Home Medications    Prior to Admission medications   Medication Sig Start Date End Date Taking? Authorizing Provider  baclofen (LIORESAL) 20 MG tablet Take 1 tablet (20 mg total) by mouth 4 (four) times daily. 06/13/16   [name], MD  buPROPion (WELLBUTRIN XL) 150 MG 24 hr tablet  09/11/15   [provider]  diphenhydrAMINE (SOMINEX) 25 MG tablet Take 25 mg by mouth at bedtime as needed for sleep.    [provider]  fluticasone (FLONASE) 50 MCG/ACT nasal spray Place into both nostrils  daily.    [provider]  folic acid (FOLVITE) 706 MCG tablet Take 400 mcg by mouth daily.    [provider]  furosemide (LASIX) 20 MG tablet Take 20 mg by mouth.    [provider]  HYDROcodone-acetaminophen (NORCO/VICODIN) 5-325 MG per tablet Take 1 tablet by mouth every 6 (six) hours as needed for moderate pain.    [provider]  naproxen sodium (ANAPROX) 220 MG tablet Take 220 mg by mouth 3 (three) times daily with meals.    [provider]  Omega-3 Fatty Acids (FISH OIL PO) Take by mouth.    [provider]  simvastatin (ZOCOR) 40 MG tablet Take 40 mg by mouth every evening.    [provider]    Family History Family History  Problem Relation Age of Onset  . Heart disease Mother     Social History Social History   Tobacco Use  . Smoking status: Never Smoker  Substance Use Topics  . Alcohol use: No    Alcohol/week: 0.0 oz  . Drug use: No     Allergies   Penicillins   Review of Systems Review of Systems  Constitutional: Negative for fever.  HENT: Negative for nosebleeds.   Eyes: Negative for redness.  Respiratory: Negative for shortness of breath.   Cardiovascular: Negative for chest pain.  Gastrointestinal: Negative for abdominal pain and  blood in stool.  Genitourinary: Positive for hematuria. Negative for dysuria and flank pain.  Musculoskeletal: Negative for back pain.  Skin: Negative for rash.  Neurological: Negative for headaches.  Hematological: Does not bruise/bleed easily.  Psychiatric/Behavioral: Negative for confusion.     Physical Exam Updated Vital Signs BP 107/79   Pulse 73   Temp 98 F (36.7 C) (Oral)   Resp 18   Ht 1.727 m (5\' 8" )   Wt 80.7 kg (178 lb)   SpO2 97%   BMI 27.06 kg/m   Physical Exam  Constitutional: He appears well-developed and well-nourished. No distress.  HENT:  Head: Atraumatic.  Eyes: Conjunctivae are normal.  Neck: Neck supple. No tracheal  deviation present.  Cardiovascular: Normal rate.  Pulmonary/Chest: Effort normal. No accessory muscle usage. No respiratory distress.  Abdominal: Soft. He exhibits no distension. There is no tenderness.  Genitourinary:  Genitourinary Comments: Normal external gu exam.   Musculoskeletal: He exhibits no edema.  Neurological: He is alert.  Skin: Skin is warm and dry. No rash noted. He is not diaphoretic.  Psychiatric: He has a normal mood and affect.  Nursing note and vitals reviewed.    ED Treatments / Results  Labs (all labs ordered are listed, but only abnormal results are displayed) Results for orders placed or performed during the hospital encounter of 06/13/08  Urinalysis, Routine w reflex microscopic  Result Value Ref Range   Color, Urine RED BIOCHEMICALS MAY BE AFFECTED BY COLOR (A)    APPearance CLOUDY (A)    Specific Gravity, Urine 1.012    pH 7.0    Glucose, UA NEGATIVE    Hgb urine dipstick LARGE (A)    Bilirubin Urine NEGATIVE    Ketones, ur NEGATIVE    Protein, ur NEGATIVE    Urobilinogen, UA 0.2    Nitrite NEGATIVE    Leukocytes, UA MODERATE (A)   Urine microscopic-add on  Result Value Ref Range   Squamous Epithelial / LPF RARE    WBC, UA 3-6    RBC / HPF TOO NUMEROUS TO COUNT    Bacteria, UA MANY (A)    Crystals CA OXALATE CRYSTALS (A)      EKG  EKG Interpretation None       Radiology No results found.  Procedures Procedures (including critical care time)  Medications Ordered in ED Medications - No data to display   Initial Impression / Assessment and Plan / ED Course  I have reviewed the triage vital signs and the nursing notes.  Pertinent labs & imaging results that were available during my care of the patient were reviewed by me and considered in my medical decision making (see chart for details).  Bladder scan.  Pt attempts to void on own.  Reviewed nursing notes and prior charts for additional history.   Keflex po.  Bladder  scan 200 cc.  Pt subsequent was able to void approx 200 cc urine.  abd soft nt.    Final Clinical Impressions(s) / ED Diagnoses   Final diagnoses:  None    ED Discharge Orders    None       Lajean Saver, MD 06/08/17 1041

## 2017-06-08 NOTE — Discharge Instructions (Signed)
It was our pleasure to provide your ER care today - we hope that you feel better.  Take antibiotic as prescribed.  Drink plenty of fluids.  Follow up with your urologist in the next few days if symptoms fail to improve/resolve.  Return to ER if worse, new symptoms, high fevers, severe pain, unable to void, other concern.

## 2017-06-08 NOTE — ED Triage Notes (Signed)
Pt states sometimes he has to self cath. States he felt urge to void, and self catheterized himself earlier today and felt resistance, so he "jabbed" the catheter through and now is experiencing hematuria. Pt states "I had some light chunks of skin coming out too."

## 2017-06-08 NOTE — ED Notes (Signed)
Pt urinated 200cc red urine. Grape size clot noted post void. No pain.

## 2017-06-08 NOTE — ED Notes (Signed)
262ml of Urine noted on bladder scan

## 2017-06-09 LAB — URINE CULTURE

## 2017-06-14 ENCOUNTER — Ambulatory Visit: Payer: Medicare Other | Admitting: Neurology

## 2017-06-15 ENCOUNTER — Encounter: Payer: Self-pay | Admitting: Neurology

## 2017-07-06 ENCOUNTER — Ambulatory Visit
Admission: RE | Admit: 2017-07-06 | Discharge: 2017-07-06 | Disposition: A | Discharge status: Home / Self Care | Payer: Medicare Other | Source: Ambulatory Visit | Attending: Family Medicine | Admitting: Family Medicine

## 2017-07-06 ENCOUNTER — Other Ambulatory Visit: Payer: Self-pay | Admitting: Family Medicine

## 2017-07-06 DIAGNOSIS — M7989 Other specified soft tissue disorders: Secondary | ICD-10-CM

## 2017-09-06 ENCOUNTER — Ambulatory Visit: Payer: Medicare HMO | Admitting: Neurology

## 2017-09-06 ENCOUNTER — Encounter (INDEPENDENT_AMBULATORY_CARE_PROVIDER_SITE_OTHER): Payer: Self-pay

## 2017-09-06 ENCOUNTER — Encounter: Payer: Self-pay | Admitting: Neurology

## 2017-09-06 DIAGNOSIS — S14105S Unspecified injury at C5 level of cervical spinal cord, sequela: Secondary | ICD-10-CM

## 2017-09-06 DIAGNOSIS — G8250 Quadriplegia, unspecified: Secondary | ICD-10-CM | POA: Diagnosis not present

## 2017-09-06 DIAGNOSIS — G800 Spastic quadriplegic cerebral palsy: Secondary | ICD-10-CM

## 2017-09-06 MED ORDER — BACLOFEN 20 MG PO TABS: 20.0000 mg | ORAL_TABLET | Freq: Four times a day (QID) | ORAL | 1 refills | Status: DC
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
## 2017-09-06 NOTE — Patient Instructions (Addendum)
I am pleased to see, that you have remained stable. I would recommend that you continue with your Baclofen 20 mg 4 times a day. I will renew your prescription. Since you have been stable on this dose, I would recommend that your primary care provider could take over the prescription again as before.  At this juncture, I will see you back as needed. I have renewed your prescription for the next 6 months, so you have time to see PCP at the scheduled appointment. I will copy her on my note.

## 2017-09-12 DIAGNOSIS — N401 Enlarged prostate with lower urinary tract symptoms: Secondary | ICD-10-CM | POA: Diagnosis not present

## 2017-09-12 DIAGNOSIS — R3914 Feeling of incomplete bladder emptying: Secondary | ICD-10-CM | POA: Diagnosis not present

## 2017-09-12 DIAGNOSIS — N3020 Other chronic cystitis without hematuria: Secondary | ICD-10-CM | POA: Diagnosis not present

## 2017-11-03 ENCOUNTER — Other Ambulatory Visit: Payer: Self-pay

## 2017-11-03 ENCOUNTER — Emergency Department (HOSPITAL_COMMUNITY)
Admission: EM | Admit: 2017-11-03 | Discharge: 2017-11-03 | Disposition: A | Discharge status: Home / Self Care | Payer: Medicare Other | Attending: Emergency Medicine | Admitting: Emergency Medicine

## 2017-11-03 ENCOUNTER — Encounter (HOSPITAL_COMMUNITY): Payer: Self-pay | Admitting: Emergency Medicine

## 2017-11-03 DIAGNOSIS — R339 Retention of urine, unspecified: Secondary | ICD-10-CM | POA: Insufficient documentation

## 2017-11-03 DIAGNOSIS — Z5321 Procedure and treatment not carried out due to patient leaving prior to being seen by health care provider: Secondary | ICD-10-CM | POA: Insufficient documentation

## 2017-11-03 LAB — URINALYSIS, ROUTINE W REFLEX MICROSCOPIC
Bilirubin Urine: NEGATIVE
Glucose, UA: NEGATIVE mg/dL
Hgb urine dipstick: NEGATIVE
Ketones, ur: NEGATIVE mg/dL
NITRITE: POSITIVE — AB
PH: 7 (ref 5.0–8.0)
Protein, ur: NEGATIVE mg/dL
SPECIFIC GRAVITY, URINE: 1.008 (ref 1.005–1.030)
Squamous Epithelial / LPF: NONE SEEN

## 2017-11-03 NOTE — ED Notes (Addendum)
Bladder scan done pt has greater than 999 in bladder, This RN inserted an In & out cathter due to extreme discomfort of patient. Output- 1500cc clear yellow urine- Pt has instant relief.

## 2017-11-03 NOTE — ED Triage Notes (Addendum)
Pt arrives with urinary retention and history of prostate issues that has caused this before. Pt has self cathed in the past but unable to due to being out of cathters.

## 2017-11-03 NOTE — ED Notes (Signed)
Patient came to desk saying he was feeling much better and wanted to leave. Patient stating he will call his Urologist later today. This tech encouraged patient to stay.

## 2018-02-27 ENCOUNTER — Other Ambulatory Visit: Payer: Self-pay | Admitting: Physician Assistant

## 2018-02-27 ENCOUNTER — Ambulatory Visit
Admission: RE | Admit: 2018-02-27 | Discharge: 2018-02-27 | Disposition: A | Discharge status: Home / Self Care | Payer: Medicare Other | Source: Ambulatory Visit | Attending: Physician Assistant | Admitting: Physician Assistant

## 2018-02-27 DIAGNOSIS — M25362 Other instability, left knee: Secondary | ICD-10-CM

## 2018-02-27 DIAGNOSIS — M25562 Pain in left knee: Secondary | ICD-10-CM

## 2018-03-28 ENCOUNTER — Telehealth: Payer: Self-pay | Admitting: Neurology

## 2018-03-28 NOTE — Telephone Encounter (Signed)
[facility]/Dr Lynnda Child [phone] scheduled appt 04/23/18 for new onset on decline in cognitive function. FYI

## 2018-03-28 NOTE — Telephone Encounter (Signed)
Noted  

## 2018-04-23 ENCOUNTER — Telehealth: Payer: Self-pay | Admitting: Neurology

## 2018-04-23 ENCOUNTER — Encounter: Payer: Self-pay | Admitting: Psychology

## 2018-04-23 ENCOUNTER — Ambulatory Visit: Payer: Medicare HMO | Admitting: Neurology

## 2018-04-23 ENCOUNTER — Encounter: Payer: Self-pay | Admitting: Neurology

## 2018-04-23 DIAGNOSIS — R413 Other amnesia: Secondary | ICD-10-CM

## 2018-04-23 NOTE — Patient Instructions (Addendum)
You have complaints of memory loss: memory loss or changes in cognitive function can have many reasons and does not always mean you have dementia. Conditions that can contribute to subjective or objective memory loss include: depression, stress, poor sleep from insomnia or sleep apnea, dehydration, fluctuation in blood sugar values, thyroid or electrolyte dysfunction and certain vitamin deficiencies. Dementia can be caused by stroke, brain atherosclerosis or brain vascular disease due to vascular risk factors (smoking, high blood pressure, high cholesterol, obesity and uncontrolled diabetes), certain degenerative brain disorders (including Parkinson's disease and Multiple sclerosis) and by Alzheimer's disease or other, more rare and sometimes hereditary causes. We will do some additional testing: blood work (which has been done recently already) and we will do a brain scan. We will not start medication as yet. We will also request a formal cognitive test called neuropsychological evaluation which is done by a licensed neuropsychologist. We will make a referral in that regard. We will call you with brain scan test results and monitor your symptoms. Your memory loss is rather mild at this point, which, of course is reassuring.      

## 2018-04-23 NOTE — Telephone Encounter (Signed)
Human pending faxed clinical notes.
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
## 2018-04-24 NOTE — Telephone Encounter (Signed)
Marvis Repress: [id number] (exp. 04/23/18 to 05/23/18) order sent to [facility]. They will contact the pt.

## 2018-04-24 NOTE — Telephone Encounter (Signed)
lvm for patient to be aware of the order sent to GI and let their number of [phone] incase he hasnt' heard in the next 2 to 3 days.

## 2018-04-28 IMAGING — US US EXTREM LOW VENOUS*L*
1 series · 13 of 24 positions shown · non-contrast
Comparison: None.

CLINICAL DATA: Left lower extremity edema.



[Series 1: us extrem low venous*left* · 0.08mm/px · 13 of 31 slices shown]
[im 1/31]
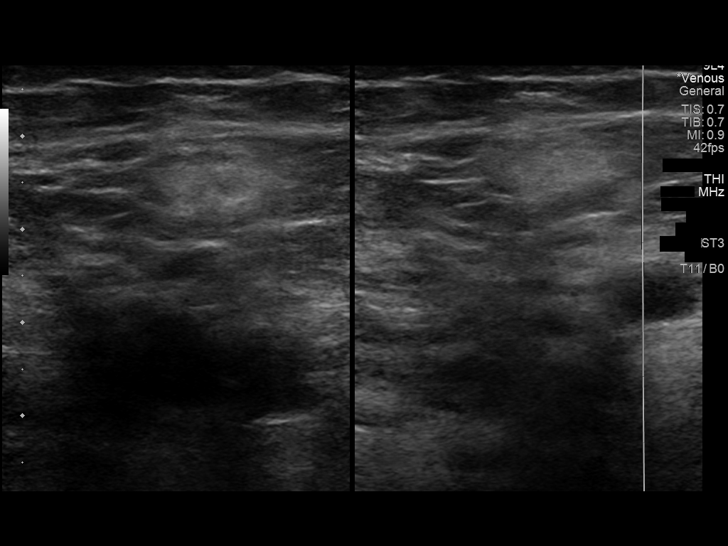
[im 3/31]
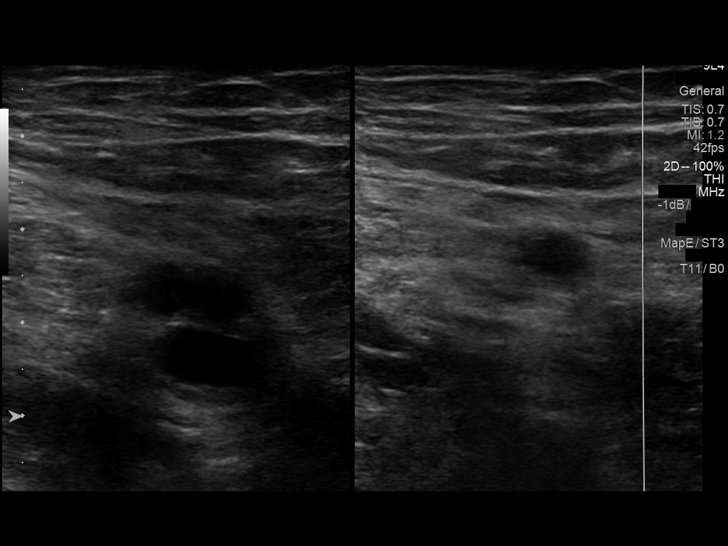
[im 6/31]
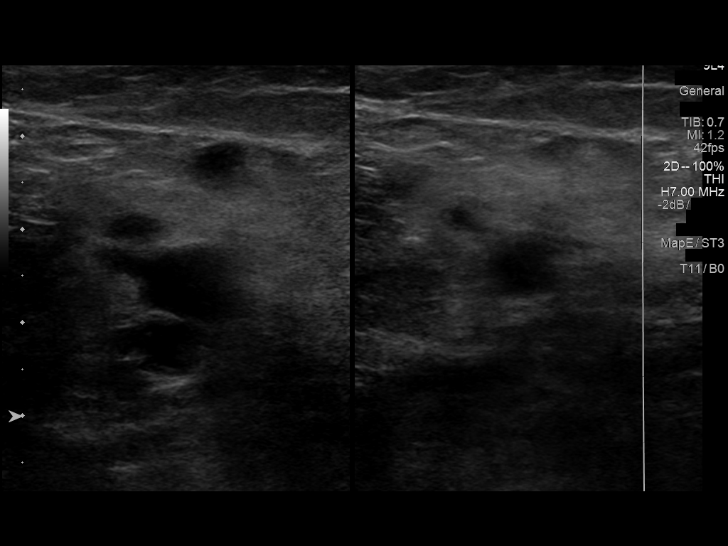
[im 8/31]
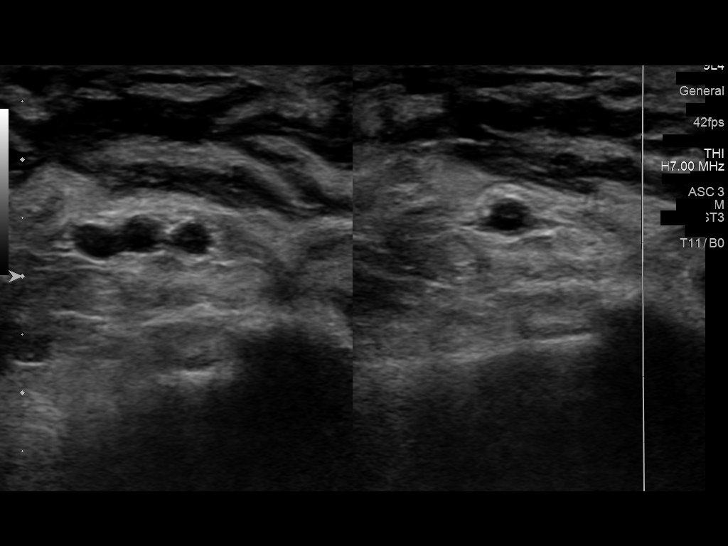
[im 11/31]
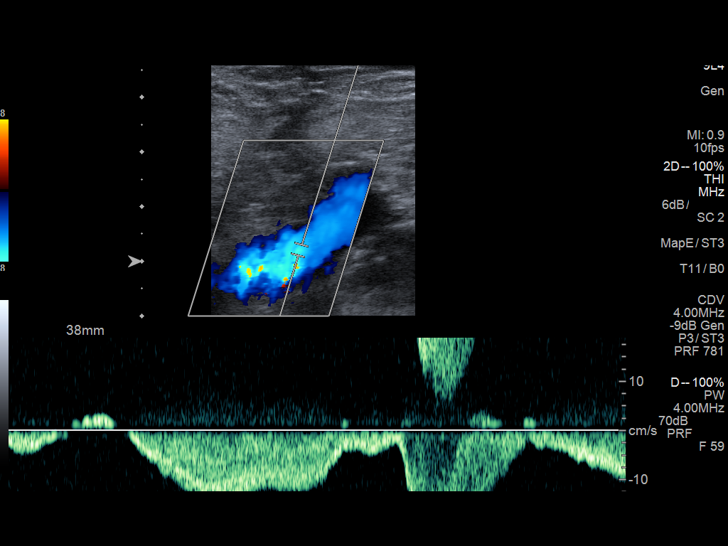
[im 14/31]
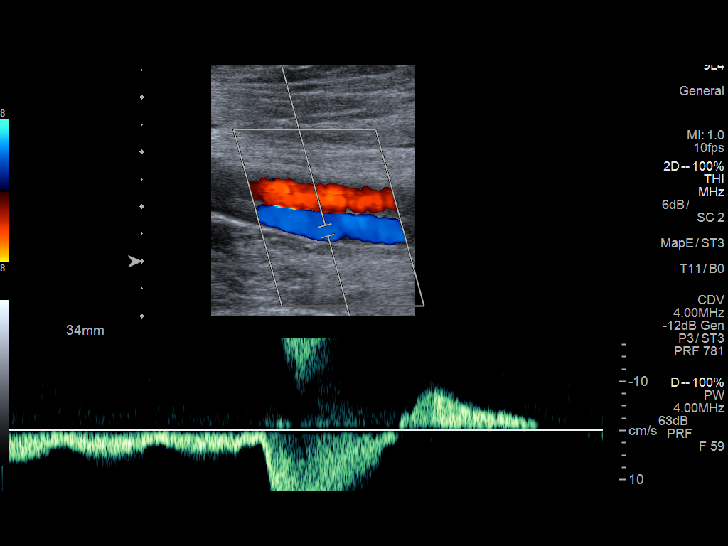
[im 16/31]
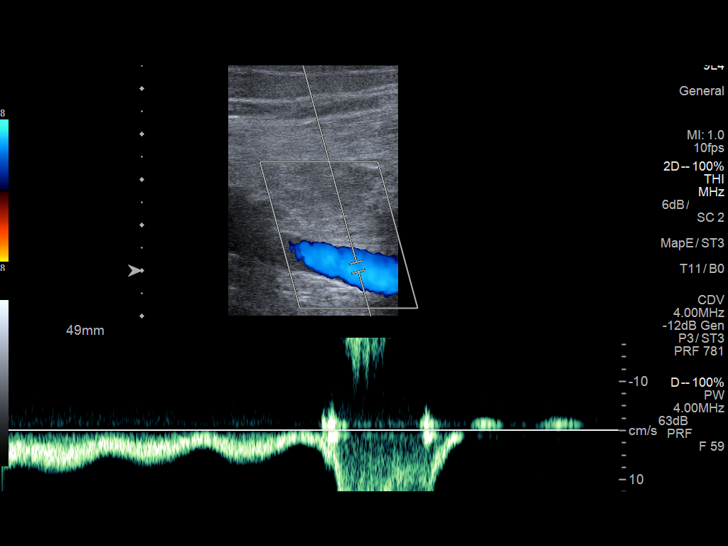
[im 17/31]
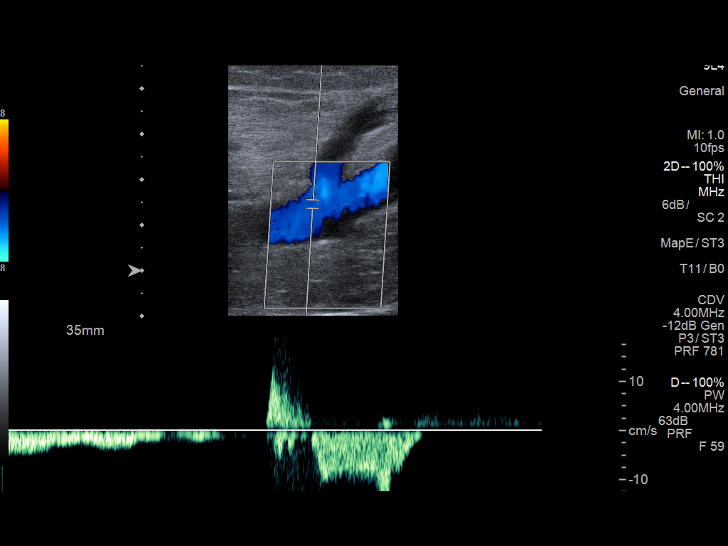
[im 20/31]
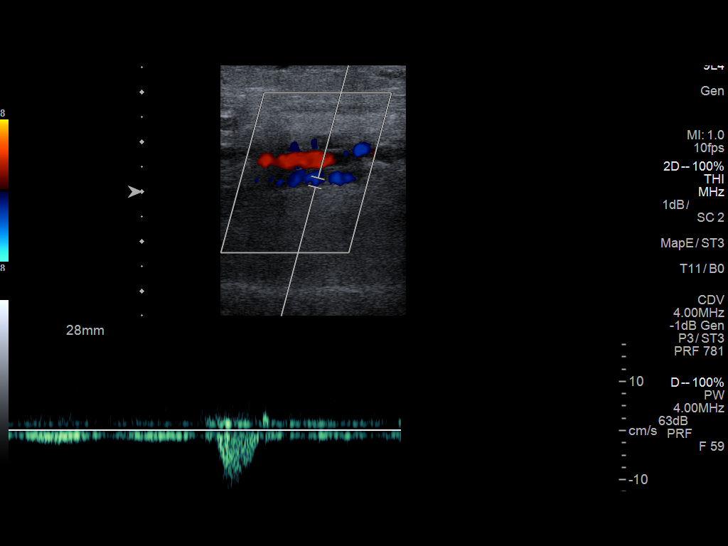
[im 23/31]
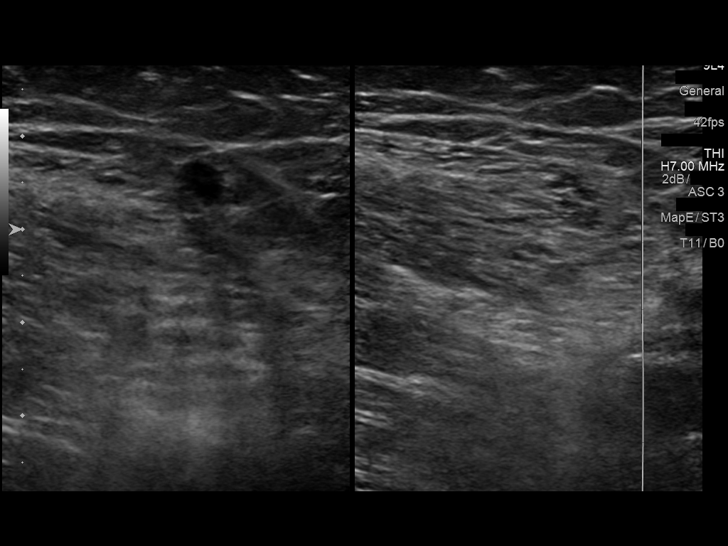
[im 25/31]
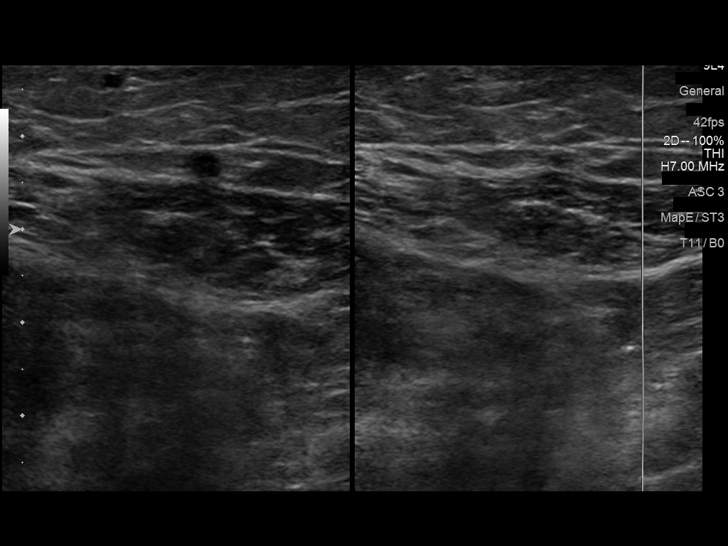
[im 28/31]
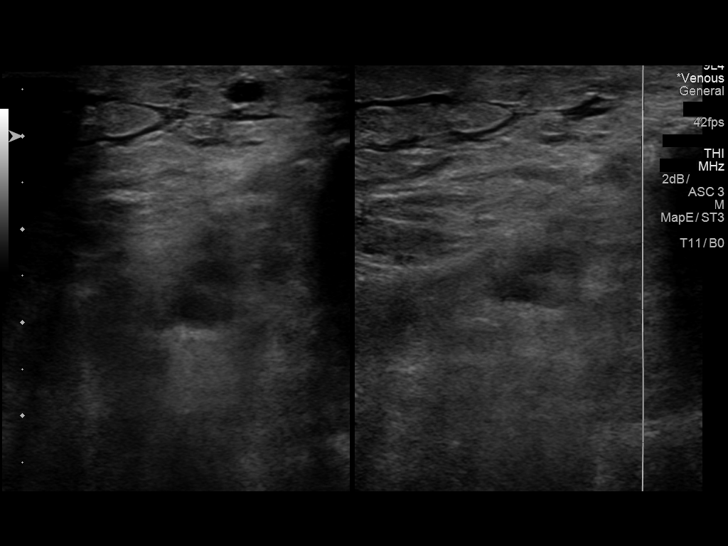
[im 31/31]
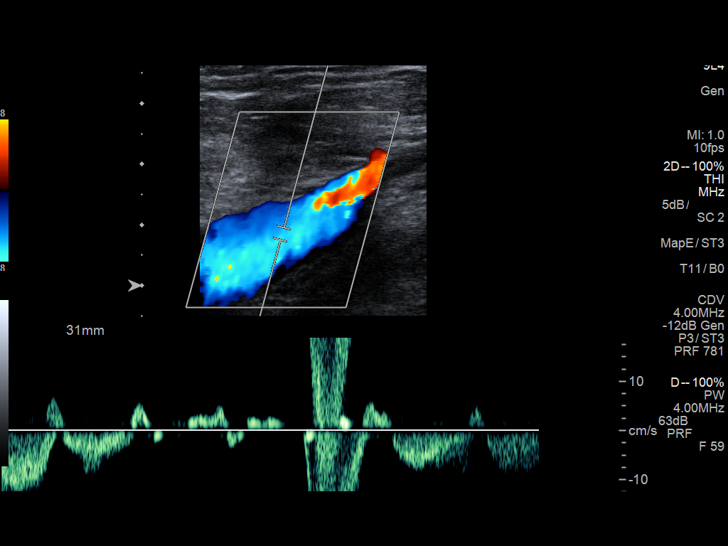

[13 of 24 positions shown; findings below may reference images not displayed]

FINDINGS: Contralateral Common Femoral Vein: Respiratory phasicity is normal
and symmetric with the symptomatic side. No evidence of thrombus.
Normal compressibility.

Common Femoral Vein: No evidence of thrombus. Normal
compressibility, respiratory phasicity and response to augmentation.

Saphenofemoral Junction: No evidence of thrombus. Normal
compressibility and flow on color Doppler imaging.

Profunda Femoral Vein: No evidence of thrombus. Normal
compressibility and flow on color Doppler imaging.

Femoral Vein: No evidence of thrombus. Normal compressibility,
respiratory phasicity and response to augmentation.

Popliteal Vein: No evidence of thrombus. Normal compressibility,
respiratory phasicity and response to augmentation.

Calf Veins: No evidence of thrombus. Normal compressibility and flow
on color Doppler imaging.

Superficial Great Saphenous Vein: No evidence of thrombus. Normal
compressibility.

Venous Reflux:  None.

Other Findings: No evidence of superficial thrombophlebitis or
abnormal fluid collection.
IMPRESSION: No evidence of left lower extremity deep venous thrombosis.

## 2018-06-13 ENCOUNTER — Other Ambulatory Visit: Payer: Self-pay | Admitting: Neurology

## 2018-06-13 DIAGNOSIS — S14105S Unspecified injury at C5 level of cervical spinal cord, sequela: Secondary | ICD-10-CM

## 2018-06-13 DIAGNOSIS — G800 Spastic quadriplegic cerebral palsy: Secondary | ICD-10-CM

## 2018-06-13 DIAGNOSIS — G8250 Quadriplegia, unspecified: Secondary | ICD-10-CM

## 2018-06-30 DIAGNOSIS — R51 Headache: Secondary | ICD-10-CM | POA: Diagnosis not present

## 2018-06-30 DIAGNOSIS — J0100 Acute maxillary sinusitis, unspecified: Secondary | ICD-10-CM | POA: Diagnosis not present

## 2018-08-17 ENCOUNTER — Ambulatory Visit
Admission: RE | Admit: 2018-08-17 | Discharge: 2018-08-17 | Disposition: A | Discharge status: Home / Self Care | Payer: Medicare Other | Source: Ambulatory Visit | Attending: Physician Assistant | Admitting: Physician Assistant

## 2018-08-17 ENCOUNTER — Other Ambulatory Visit: Payer: Self-pay | Admitting: Physician Assistant

## 2018-08-17 DIAGNOSIS — R55 Syncope and collapse: Secondary | ICD-10-CM

## 2018-08-17 DIAGNOSIS — M542 Cervicalgia: Secondary | ICD-10-CM

## 2018-08-17 DIAGNOSIS — M549 Dorsalgia, unspecified: Secondary | ICD-10-CM

## 2018-08-30 ENCOUNTER — Telehealth: Payer: Self-pay
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
## 2018-09-04 ENCOUNTER — Encounter: Payer: Self-pay | Admitting: Neurology

## 2018-09-04 ENCOUNTER — Ambulatory Visit (INDEPENDENT_AMBULATORY_CARE_PROVIDER_SITE_OTHER): Payer: Medicare Other | Admitting: Neurology

## 2018-09-04 VITALS — BP 115/77 | HR 79 | Ht 68.0 in | Wt 186.0 lb

## 2018-09-04 DIAGNOSIS — G479 Sleep disorder, unspecified: Secondary | ICD-10-CM | POA: Diagnosis not present

## 2018-09-04 DIAGNOSIS — R55 Syncope and collapse: Secondary | ICD-10-CM | POA: Diagnosis not present

## 2018-09-04 DIAGNOSIS — R0683 Snoring: Secondary | ICD-10-CM | POA: Diagnosis not present

## 2018-09-04 NOTE — Progress Notes (Signed)
Epworth Sleepiness Scale 0= would never doze 1= slight chance of dozing 2= moderate chance of dozing 3= high chance of dozing  Sitting and reading: 3 Watching TV: 2 Sitting inactive in a public place (ex. Theater or meeting): 3 As a passenger in a car for an hour without a break: 2 Lying down to rest in the afternoon:3 Sitting and talking to someone: 2 Sitting quietly after lunch (no alcohol): 1 In a car, while stopped in traffic: 2 Total: 18
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
## 2018-09-06 NOTE — Telephone Encounter (Signed)
Dr. Rexene Alberts agreed to fill out pt;s DMV form. Form has been completed and sent to MR for processing.

## 2018-09-19 ENCOUNTER — Ambulatory Visit: Payer: Medicare Other | Admitting: Neurology

## 2018-09-19 ENCOUNTER — Telehealth: Payer: Self-pay

## 2018-09-19 DIAGNOSIS — R55 Syncope and collapse: Secondary | ICD-10-CM

## 2018-09-19 DIAGNOSIS — R0683 Snoring: Secondary | ICD-10-CM

## 2018-09-19 DIAGNOSIS — G479 Sleep disorder, unspecified: Secondary | ICD-10-CM

## 2018-09-19 NOTE — Telephone Encounter (Signed)
I called pt to discuss his EEG results. No answer, left a message asking him to call me back. 

## 2018-09-19 NOTE — Progress Notes (Signed)
Please call and advise the patient that the EEG or brain wave test we performed was reported as normal in the awake and asleep states. We checked for abnormal electrical discharges in the brain waves and the report suggested normal findings. No further action is required on this test at this time. Please remind patient to keep any upcoming appointments or tests and to call us with any interim questions, concerns, problems or updates.  Proceed with brain MRI and sleep study (PSG not scheduled yet, please inquire with the lab). Thanks,  [name], MD, PhD

## 2018-09-19 NOTE — Telephone Encounter (Signed)
-----   Message from [name], MD sent at 09/19/2018  4:40 PM EST ----- Please call and advise the patient that the EEG or brain wave test we performed was reported as normal in the awake and asleep states. We checked for abnormal electrical discharges in the brain waves and the report suggested normal findings. No further action is required on this test at this time. Please remind patient to keep any upcoming appointments or tests and to call us with any interim questions, concerns, problems or updates.  Proceed with brain MRI and sleep study (PSG not scheduled yet, please inquire with the lab). Thanks,  [name], MD, PhD

## 2018-09-19 NOTE — Procedures (Signed)
    History:  Steven Foster is a 63 year old gentleman with a history of memory loss and decreased cognitive functioning.  The patient is being evaluated for a mild memory loss that has occurred.  This is a routine EEG.  No skull defects are noted.  Medications include baclofen, Flonase, folic acid, Lasix, hydrocodone, Zocor, and diphenhydramine.  EEG classification: Normal awake and asleep  Description of the recording: The background rhythms of this recording consists of a fairly well modulated medium amplitude background activity of 9 Hz. As the record progresses, the patient initially is in the waking state, but appears to enter the early stage II sleep during the recording, with rudimentary sleep spindles and vertex sharp wave activity seen. During the wakeful state, photic stimulation is performed, and this results in a bilateral and symmetric photic driving response. Hyperventilation was also performed, and this results in a minimal buildup of the background rhythm activities without significant slowing seen. At no time during the recording does there appear to be evidence of spike or spike wave discharges or evidence of focal slowing. EKG monitor shows no evidence of cardiac rhythm abnormalities with a heart rate of 72.  Impression: This is a normal EEG recording in the waking and sleeping state. No evidence of ictal or interictal discharges were seen at any time during the recording.

## 2018-09-20 ENCOUNTER — Ambulatory Visit
Admission: RE | Admit: 2018-09-20 | Discharge: 2018-09-20 | Disposition: A | Discharge status: Home / Self Care | Payer: Medicare Other | Source: Ambulatory Visit | Attending: Neurology | Admitting: Neurology

## 2018-09-20 ENCOUNTER — Telehealth: Payer: Self-pay | Admitting: *Deleted

## 2018-09-20 DIAGNOSIS — R413 Other amnesia: Secondary | ICD-10-CM

## 2018-09-20 MED ORDER — GADOBENATE DIMEGLUMINE 529 MG/ML IV SOLN
17.0000 mL | Freq: Once | INTRAVENOUS | Status: AC | PRN
  Administered 2018-09-20: 17 mL via INTRAVENOUS

## 2018-09-20 NOTE — Telephone Encounter (Signed)
Please call patient and advise him that testing is still underway. He is going to get a phone call from [place] soon about the MRI results. He still needs to have a sleep study and neuropsychological evaluation. I cannot not write a letter of support to clear him for driving until we have completed testing. He can pick his letter up in the meantime, since we have no way of safeguarding the [facility] paperwork/letter. Also, I did recommend he have evaluation with a cardiologist to r/o a cardiac cause for syncope. Please remind him that he should talk to his primary care about this.

## 2018-09-20 NOTE — Progress Notes (Signed)
Brain scan showed age-appropriate findings, no acute findings, no abnormal contrast uptake, no significant volume loss or atrophy/shrinkage.  Please call pt to update him.

## 2018-09-20 NOTE — Telephone Encounter (Signed)
Received form from [facility] requesting letter from Dr Rexene Alberts. Form placed on Dr Guadelupe Sabin desk for review.

## 2018-09-21 NOTE — Telephone Encounter (Signed)
I called pt to discuss his MRI and EEG results. No answer, left a message asking him to call me back.

## 2018-09-21 NOTE — Telephone Encounter (Signed)
-----   Message from [name], MD sent at 09/20/2018  5:49 PM EST ----- Brain scan showed age-appropriate findings, no acute findings, no abnormal contrast uptake, no significant volume loss or atrophy/shrinkage.  Please call pt to update him.

## 2018-09-21 NOTE — Telephone Encounter (Signed)
I have called this pt again this morning to discuss his EEG and MRI results. When he returns my call, I will discuss this as well.

## 2018-09-24 NOTE — Telephone Encounter (Signed)
I called pt again to discuss. No answer, left a message asking him to call me back. 

## 2018-09-25 ENCOUNTER — Encounter: Payer: Medicare Other | Admitting: Psychology

## 2018-09-25 NOTE — Telephone Encounter (Signed)
I have mailed pt a letter asking him to call us back and given the DMV form to MR.

## 2018-09-25 NOTE — Telephone Encounter (Signed)
I called pt again to discuss his MRI and EEG results as well as his DMV form. No answer, left a message asking him to call me back. This is my third unsuccessful attempt at reaching him by phone. Will send him a letter asking him to call us back.

## 2018-09-25 NOTE — Telephone Encounter (Signed)
See telephone note from 09/25/18.

## 2018-09-26 ENCOUNTER — Ambulatory Visit
Admission: RE | Admit: 2018-09-26 | Discharge: 2018-09-26 | Disposition: A | Discharge status: Home / Self Care | Payer: Medicare Other | Source: Ambulatory Visit | Attending: Physician Assistant | Admitting: Physician Assistant

## 2018-09-26 ENCOUNTER — Other Ambulatory Visit: Payer: Self-pay | Admitting: Physician Assistant

## 2018-09-26 ENCOUNTER — Other Ambulatory Visit: Payer: Self-pay

## 2018-09-26 DIAGNOSIS — M25562 Pain in left knee: Principal | ICD-10-CM

## 2018-09-26 DIAGNOSIS — M25561 Pain in right knee: Secondary | ICD-10-CM

## 2018-10-02 NOTE — Telephone Encounter (Signed)
Called Patient about his referral . Dr. Sima Matas called Patient on 09/06/2018 . To Try and get him scheduled.  General 09/06/2018 1:21 PM Roetta Sessions - -  Note   Referral from Edie athar accepted to jr LVM           I called patient and left him the number to call and schedule his Appointment . [phone]. Thanks Hinton Dyer.
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
## 2018-10-17 ENCOUNTER — Telehealth: Payer: Self-pay | Admitting: *Deleted

## 2018-10-17 NOTE — Telephone Encounter (Signed)
Pt wife going to call back on Thursdays, she was @ work unable to talk.

## 2018-10-18 ENCOUNTER — Telehealth: Payer: Self-pay | Admitting: *Deleted

## 2018-10-18 ENCOUNTER — Encounter: Payer: Self-pay | Admitting: Psychology

## 2018-10-18 NOTE — Telephone Encounter (Signed)
Please also advise patient or wife that I recommended and still recommend a cardiac evaluation, as a cardiac cause for his passing out is not yet excluded. He may need to pick up DMV form and talk to PCP about driving privileges in the interim, since we have not been able to do evals. Please offer sending form back to patient in the mail and he can talk to PCP. May need PCP referral for cardiology as well.

## 2018-10-18 NOTE — Telephone Encounter (Signed)
Pt wife called need update on husband DMV form. Please call [phone].

## 2018-10-18 NOTE — Telephone Encounter (Signed)
I called pt, spoke to pt and his wife, Shirlean Mylar, per DPR. I reminded them that Dr. Rexene Alberts has asked that he also complete his sleep study and neuro psych testing before she reviews pt's DMV form. I gave them Dr. Gaspar Cola phone number and our sleep lab phone number to schedule these tests. Pt's appt with Dr. Rexene Alberts on 10/24/2018 was cancelled since pt was just seen in February and he has not completed the testing.

## 2018-10-18 NOTE — Telephone Encounter (Signed)
I mailed pt blank DMV form to home address, the billing dept will take care of refund.

## 2018-10-18 NOTE — Telephone Encounter (Signed)
Pt returned my call and I discussed Dr. Guadelupe Sabin recommendations with him. He will speak with his PCP regarding a cardiology referral and driving privleges. Pt asked that his DMV form be mailed to him.

## 2018-10-18 NOTE — Telephone Encounter (Signed)
Pt said he pd $50 to have forms filled out but Dr Rexene Alberts is not handling this now. He is wanting to get refund

## 2018-10-18 NOTE — Telephone Encounter (Signed)
I called pt again. No answer, left a message asking him to call me back.

## 2018-10-24 ENCOUNTER — Ambulatory Visit: Payer: Medicare HMO | Admitting: Neurology

## 2018-10-30 ENCOUNTER — Other Ambulatory Visit: Payer: Self-pay | Admitting: Neurology

## 2018-10-30 DIAGNOSIS — G8250 Quadriplegia, unspecified: Secondary | ICD-10-CM

## 2018-10-30 DIAGNOSIS — S14105S Unspecified injury at C5 level of cervical spinal cord, sequela: Secondary | ICD-10-CM

## 2018-11-01 ENCOUNTER — Other Ambulatory Visit: Payer: Self-pay | Admitting: Neurology

## 2018-11-01 DIAGNOSIS — G800 Spastic quadriplegic cerebral palsy: Secondary | ICD-10-CM

## 2018-11-01 DIAGNOSIS — S14105S Unspecified injury at C5 level of cervical spinal cord, sequela: Secondary | ICD-10-CM

## 2018-11-01 DIAGNOSIS — G8250 Quadriplegia, unspecified: Secondary | ICD-10-CM

## 2018-11-02 ENCOUNTER — Other Ambulatory Visit: Payer: Self-pay | Admitting: Neurology

## 2018-11-02 DIAGNOSIS — S14105S Unspecified injury at C5 level of cervical spinal cord, sequela: Secondary | ICD-10-CM

## 2018-11-02 DIAGNOSIS — G8250 Quadriplegia, unspecified: Secondary | ICD-10-CM

## 2018-11-26 ENCOUNTER — Telehealth: Payer: Self-pay | Admitting: Neurology

## 2018-11-26 NOTE — Telephone Encounter (Signed)
Pt called in stating he needs a letter stating why he needs sand extension for his DMV form , as far as testing

## 2018-11-26 NOTE — Telephone Encounter (Signed)
Please call patient: what does the letter need to say?

## 2018-11-27 NOTE — Telephone Encounter (Signed)
I called pt to discuss. No answer, left a message asking him to call me back. 

## 2018-11-28 NOTE — Telephone Encounter (Signed)
Pt returned call stating that the [facility] is needing documentation stating results of any Neuro Eval he has had and documentation that he is medically cleared to drive with out any recommended restrictions.  Pt says these can be faxed to [phone] or mail to: Medical Review Program [address] [place], [place] [address]

## 2018-11-28 NOTE — Telephone Encounter (Signed)
I called pt again to discuss. No answer, left a message asking him to call me back. 

## 2018-11-29 NOTE — Telephone Encounter (Signed)
Please call patient back and advise him that he has not had all the recommended tests and therefore I cannot clear him from my end of things for driving. Also ask him regarding cardiology evaluation and whether he has had a FU with PCP regarding all this.

## 2018-11-29 NOTE — Telephone Encounter (Signed)
I called pt to discuss. No answer, left a message asking him to call me back. 

## 2018-11-29 NOTE — Telephone Encounter (Signed)
I do not see cardiology records or a sleep study. Pt is scheduled for 01/22/2019 for neuro psych eval. I do not think we can give medical clearance for pt to drive since this has not been completed.

## 2018-12-03 NOTE — Telephone Encounter (Signed)
I called pt again to discuss, no answer, left a message asking her to call me back.

## 2018-12-04 NOTE — Telephone Encounter (Signed)
I have attempted to contact patient on 09/12/2018, 09/20/2018, 11/26/2018 with no return call back. I will reach out to patient again this afternoon.

## 2018-12-04 NOTE — Telephone Encounter (Signed)
Pt returned my call. He has not completed the sleep study, neuro psych eval, or cardiology eval. I explained Dr. Guadelupe Sabin recommendations. Pt will call his PCP to discuss a cardiology referral. Pt is asking for our sleep lab to call him to schedule his sleep study. Pt verbalized understanding of recommendations.

## 2018-12-04 NOTE — Telephone Encounter (Signed)
I called pt again to discuss. No answer, left a message asking him to call me back. This is my third unsuccessful attempt at reaching pt by phone to discuss. I will send pt a letter asking him to call me back.

## 2018-12-12 ENCOUNTER — Telehealth: Payer: Self-pay | Admitting: *Deleted

## 2018-12-12 NOTE — Telephone Encounter (Signed)
error 

## 2018-12-25 ENCOUNTER — Telehealth: Payer: Self-pay | Admitting: Cardiology

## 2018-12-25 ENCOUNTER — Telehealth: Payer: Self-pay | Admitting: *Deleted

## 2018-12-25 NOTE — Telephone Encounter (Signed)
smartphone/ my chart via emailed/ consent/ pre reg completed

## 2018-12-25 NOTE — Telephone Encounter (Signed)
   Primary Cardiologist: DR Shannon   Pt contacted.  History and symptoms reviewed.  Pt will f/u with [facility] provider as scheduled.  Pt. advised that we are restricting visitors at this time and request that only patients present for check-in prior to their appointment.  All other visitors should remain in their car.  If necessary, only one visitor may come with the patient, into the building. For everyone's safety, all patients and visitor entering our practice area should expect to be screened again prior to entering our waiting area.  Raiford Simmonds, RN  12/25/2018 12:45 PM       COVID-19 Pre-Screening Questions:  . In the past 7 to 10 days have you had a cough,  shortness of breath, headache, congestion, fever (100 or greater) body aches, chills, sore throat, or sudden loss of taste or sense of smell?NO . Have you been around anyone with known Covid 19. NO . Have you been around anyone who is awaiting Covid 19 test results in the past 7 to 10 days?NOHave you been around anyone who has been exposed to Covid 19, or has mentioned symptoms of Covid 19 within the past 7 to 10 days? NO  If you have any concerns/questions about symptoms patients report during screening (either on the phone or at threshold). Contact the provider seeing the patient or DOD for further guidance.  If neither are available contact a member of the leadership team.

## 2018-12-26 ENCOUNTER — Ambulatory Visit (INDEPENDENT_AMBULATORY_CARE_PROVIDER_SITE_OTHER): Payer: Medicare Other | Admitting: Cardiology

## 2018-12-26 ENCOUNTER — Telehealth: Payer: Self-pay | Admitting: Licensed Clinical Social Worker

## 2018-12-26 ENCOUNTER — Other Ambulatory Visit: Payer: Self-pay

## 2018-12-26 ENCOUNTER — Telehealth: Payer: Self-pay | Admitting: Radiology

## 2018-12-26 ENCOUNTER — Encounter: Payer: Self-pay | Admitting: Cardiology

## 2018-12-26 DIAGNOSIS — R55 Syncope and collapse: Secondary | ICD-10-CM | POA: Diagnosis not present

## 2018-12-26 DIAGNOSIS — S14109S Unspecified injury at unspecified level of cervical spinal cord, sequela: Secondary | ICD-10-CM | POA: Diagnosis not present

## 2018-12-26 DIAGNOSIS — R634 Abnormal weight loss: Secondary | ICD-10-CM

## 2018-12-26 DIAGNOSIS — I959 Hypotension, unspecified: Secondary | ICD-10-CM

## 2018-12-26 NOTE — Patient Instructions (Addendum)
Medication Instructions:  Your physician recommends that you continue on your current medications as directed. Please refer to the Current Medication list given to you today. If you need a refill on your cardiac medications before your next appointment, please call your pharmacy.   Lab work: None  If you have labs (blood work) drawn today and your tests are completely normal, you will receive your results only by: Marland Kitchen MyChart Message (if you have MyChart) OR . A paper copy in the mail If you have any lab test that is abnormal or we need to change your treatment, we will call you to review the results.  Testing/Procedures: Your physician has requested that you have an echocardiogram. Echocardiography is a painless test that uses sound waves to create images of your heart. It provides your doctor with information about the size and shape of your heart and how well your heart's chambers and valves are working. This procedure takes approximately one hour. There are no restrictions for this procedure.  Your physician has recommended that you wear an 30 event monitor. Event monitors are medical devices that record the heart's electrical activity. Doctors most often [place] these monitors to diagnose arrhythmias. Arrhythmias are problems with the speed or rhythm of the heartbeat. The monitor is a small, portable device. You can wear one while you do your normal daily activities. This is usually used to diagnose what is causing palpitations/syncope (passing out). THE MONITOR TECH WILL BE IN TOUCH WITH YOU ABOUT YOUR MONITOR.  Your physician has requested that you have a carotid duplex. This test is an ultrasound of the carotid arteries in your neck. It looks at blood flow through these arteries that supply the brain with blood. Allow one hour for this exam. There are no restrictions or special instructions.  Follow-Up: At [facility], you and your health needs are our priority.  As part of our continuing  mission to provide you with exceptional heart care, we have created designated Provider Care Teams.  These Care Teams include your primary Cardiologist (physician) and Advanced Practice Providers (APPs -  Physician Assistants and Nurse Practitioners) who all work together to provide you with the care you need, when you need it. You will need a follow up appointment in 2 months VIRTUAL VISIT.  (AFTER MONITOR) Please call our office 2 months in advance to schedule this appointment.  You may see DR DAVID HARDING  or one of the following Advanced Practice Providers on your designated Care Team:   Rosaria Ferries, PA-C . Jory Sims, DNP, ANP  Any Other Special Instructions Will Be Listed Below (If Applicable).  YOU WILL NEED TO CHECK YOUR BLOOD PRESSURE 3-4 TIMES A WEEK IN THE MORNING   MAKE SURE YOU EAT ENOUGH BREAKFAST AND DRINK ENOUGH FLUIDS SO YOUR BLOOD PRESSURE DOES NOT DROP

## 2018-12-26 NOTE — Telephone Encounter (Signed)
Patient called back returning Katie's call
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
## 2018-12-26 NOTE — Telephone Encounter (Signed)
Tried calling patient to verify his address/insurance and briefly go over the monitor that will be mailed to him
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
## 2018-12-26 NOTE — Telephone Encounter (Signed)
Spoke with patient. He was enrolled for a 30 day Preventcie Event monitor to be mailed. Brief instructions were gone over and he knows to expect the monitor to arrive in 3-4 days

## 2018-12-26 NOTE — Telephone Encounter (Signed)
CSW received referral to assist patient with BP cuff and transport needs. CSW contacted patient although message left for return call. CSW did order BP cuff to be delivered to patient's home on Friday. CSW will await return call for transport needs. Raquel Sarna, [place], [place]

## 2018-12-28 ENCOUNTER — Other Ambulatory Visit: Payer: Self-pay | Admitting: Neurology

## 2018-12-28 ENCOUNTER — Encounter: Payer: Self-pay | Admitting: Cardiology

## 2018-12-28 DIAGNOSIS — G8250 Quadriplegia, unspecified: Secondary | ICD-10-CM

## 2018-12-28 DIAGNOSIS — S14105S Unspecified injury at C5 level of cervical spinal cord, sequela: Secondary | ICD-10-CM

## 2018-12-28 NOTE — Assessment & Plan Note (Signed)
Although not directly involved with his current episode, it is something that is concerning that I want to discuss with his PCP.  Most of the weight loss has come since this episode.  This was suggested is less side effect: Further syncopal episodes, but certainly decreased nutrition would set him up for potential episode.

## 2018-12-28 NOTE — Assessment & Plan Note (Signed)
He claims that he does not usually have blood pressures this low, but I do worry but if he has a tendency to hypotension, and takes Lasix for edema, he potentially could have had hypotension related dizziness, that would be unlikely after having eaten and it being later in the morning.  I simply encouraged her to make sure that he stays adequately hydrated and eats correctly especially given his weight loss.  Certainly if he was not going to pass out otherwise, with low blood pressure he was more prone to it from another etiology.

## 2018-12-28 NOTE — Assessment & Plan Note (Signed)
His "syncope "episode sounds more as though he just fell asleep.  It sounds consistent with the fact that he had taken a soporific night for, and it was not fully out of his system.  He describes more of falling asleep than losing consciousness.  He has no signs or symptoms to suspect major with no prodrome.  He also denies any rapid palpitations leaving the only real syncopal etiology could be cardiac to me bradycardic or pause related.  Plan: We will check 2D echocardiogram and carotid Dopplers simply to exclude structural abnormalities despite relatively normal exam. More poorly will do a 30-day event monitor to exclude any potential bradycardia arrhythmias or pauses that may be even occur at sleep.
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
## 2018-12-30 ENCOUNTER — Ambulatory Visit (INDEPENDENT_AMBULATORY_CARE_PROVIDER_SITE_OTHER): Payer: Medicare Other | Admitting: Neurology

## 2018-12-30 DIAGNOSIS — G4733 Obstructive sleep apnea (adult) (pediatric): Secondary | ICD-10-CM

## 2018-12-30 DIAGNOSIS — G4720 Circadian rhythm sleep disorder, unspecified type: Secondary | ICD-10-CM

## 2018-12-30 DIAGNOSIS — R0683 Snoring: Secondary | ICD-10-CM

## 2019-01-03 ENCOUNTER — Telehealth (HOSPITAL_COMMUNITY): Payer: Self-pay

## 2019-01-03 ENCOUNTER — Telehealth: Payer: Self-pay

## 2019-01-03 NOTE — Telephone Encounter (Signed)
Pt has returned the call to RN Kristen, he is asking for a call back °

## 2019-01-03 NOTE — Telephone Encounter (Signed)
I called pt back. No answer, left a message asking him to call me back.
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
## 2019-01-03 NOTE — Addendum Note (Signed)
Addended by: [name] on: 01/03/2019 08:17 AM   Modules accepted: Orders

## 2019-01-03 NOTE — Telephone Encounter (Signed)
I called pt to discuss his sleep study results. No answer, left a message asking him to call me back. 

## 2019-01-03 NOTE — Telephone Encounter (Signed)

## 2019-01-03 NOTE — Telephone Encounter (Signed)
LMTCB COVID prescreening for echo. No echo appointment details left on VM due to no scanned DPR in chart.
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
## 2019-01-04 ENCOUNTER — Other Ambulatory Visit: Payer: Self-pay

## 2019-01-04 ENCOUNTER — Ambulatory Visit (HOSPITAL_COMMUNITY): Payer: Medicare Other | Attending: Cardiology

## 2019-01-04 DIAGNOSIS — R55 Syncope and collapse: Secondary | ICD-10-CM | POA: Diagnosis present

## 2019-01-04 DIAGNOSIS — I959 Hypotension, unspecified: Secondary | ICD-10-CM | POA: Diagnosis not present

## 2019-01-04 DIAGNOSIS — R634 Abnormal weight loss: Secondary | ICD-10-CM | POA: Insufficient documentation

## 2019-01-07 NOTE — Telephone Encounter (Signed)
I called pt to discuss again. No answer, left a message asking him to call me back.

## 2019-01-08 NOTE — Telephone Encounter (Signed)
Pt returned my call. I advised pt that Dr. Rexene Alberts reviewed their sleep study results and found that pt has mild to moderate osa. Dr. Rexene Alberts recommends that pt start an auto pap at home. I reviewed PAP compliance expectations with the pt. Pt is agreeable to starting an auto-PAP. I advised pt that an order will be sent to a DME, [facility], and Huey Romans will call the pt within about one week after they file with the pt's insurance. Huey Romans will show the pt how to use the machine, fit for masks, and troubleshoot the auto-PAP if needed. A follow up appt was made for insurance purposes with Dr. Rexene Alberts on 03/18/2019 at 10:30am. Pt verbalized understanding to arrive 15 minutes early and bring their auto-PAP. A letter with all of this information in it will be mailed to the pt as a reminder. I verified with the pt that the address we have on file is correct. Pt verbalized understanding of results. Pt had no questions at this time but was encouraged to call back if questions arise. I have sent the order to [facility] and have received confirmation that they have received the order.

## 2019-01-11 ENCOUNTER — Ambulatory Visit (HOSPITAL_COMMUNITY)
Admission: RE | Admit: 2019-01-11 | Discharge: 2019-01-11 | Disposition: A | Discharge status: Home / Self Care | Payer: Medicare Other | Source: Ambulatory Visit | Attending: Cardiology | Admitting: Cardiology

## 2019-01-11 ENCOUNTER — Other Ambulatory Visit: Payer: Self-pay

## 2019-01-11 DIAGNOSIS — R634 Abnormal weight loss: Secondary | ICD-10-CM | POA: Diagnosis not present

## 2019-01-11 DIAGNOSIS — I959 Hypotension, unspecified: Secondary | ICD-10-CM | POA: Insufficient documentation

## 2019-01-11 DIAGNOSIS — R55 Syncope and collapse: Secondary | ICD-10-CM | POA: Diagnosis present

## 2019-01-22 ENCOUNTER — Telehealth: Payer: Self-pay | Admitting: *Deleted

## 2019-01-22 ENCOUNTER — Other Ambulatory Visit: Payer: Self-pay

## 2019-01-22 ENCOUNTER — Encounter: Payer: Self-pay | Admitting: Psychology

## 2019-01-22 ENCOUNTER — Encounter: Payer: Medicare Other | Attending: Psychology | Admitting: Psychology

## 2019-01-22 DIAGNOSIS — S14109S Unspecified injury at unspecified level of cervical spinal cord, sequela: Secondary | ICD-10-CM | POA: Diagnosis present

## 2019-01-22 DIAGNOSIS — G4733 Obstructive sleep apnea (adult) (pediatric): Secondary | ICD-10-CM | POA: Diagnosis present

## 2019-01-22 DIAGNOSIS — R413 Other amnesia: Secondary | ICD-10-CM | POA: Insufficient documentation

## 2019-01-22 DIAGNOSIS — R55 Syncope and collapse: Secondary | ICD-10-CM | POA: Diagnosis present

## 2019-01-22 NOTE — Telephone Encounter (Signed)
Left message to call back -- in regards to test results 
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
## 2019-01-22 NOTE — Telephone Encounter (Signed)
-----   Message from Leonie Man, MD sent at 01/22/2019 12:41 AM EDT ----- Neck artery Dopplers show minimal carotid artery disease.  Normal subclavian arteries.  The right vertebral artery looks to be normal, but the left vertebral artery seems to be closed.  In and of itself, this is not usually something to be worried about (as far as passing out goes), however, if there is another episode of passing out, we would probably want to do a more detailed CT angiogram of the neck.  Glenetta Hew, MD

## 2019-01-30 NOTE — Telephone Encounter (Signed)
Pt informed of providers result & recommendations. Pt verbalized understanding. No further questions. Pt will CB if this happens again

## 2019-02-25 ENCOUNTER — Other Ambulatory Visit: Payer: Self-pay

## 2019-02-25 ENCOUNTER — Encounter: Payer: Medicare Other | Attending: Psychology | Admitting: Psychology

## 2019-02-25 DIAGNOSIS — G4733 Obstructive sleep apnea (adult) (pediatric): Secondary | ICD-10-CM | POA: Insufficient documentation

## 2019-02-25 DIAGNOSIS — S14109S Unspecified injury at unspecified level of cervical spinal cord, sequela: Secondary | ICD-10-CM | POA: Diagnosis present

## 2019-02-25 DIAGNOSIS — R55 Syncope and collapse: Secondary | ICD-10-CM | POA: Diagnosis present

## 2019-02-25 DIAGNOSIS — R413 Other amnesia: Secondary | ICD-10-CM | POA: Insufficient documentation

## 2019-02-25 NOTE — Progress Notes (Signed)
The patient arrived on time to his 13:00 testing appointment. The evaluation lasted 240 minutes.  Mental Status & Behavioral Observations:  Appearance: Casually dressed, appropriately groomed.  Gait: Used cane for assistance  Speech:  Clear, normal rate, tone, & volume Thought process: Linear, organized and concrete.  Mood & Affect: Euthymic, good frustration tolerance. Interpersonal: Pleasant, appropriate Orientation: Oriented x 4  Tests Administered:  Wechsler Adult Intelligence Scale-Fourth Edition (WAIS-IV)  Wechsler Memory Scale-Fourth Edition (WMS-IV) Adult Version (ages 72-69)  Results:  Composite Score Summary  Scale Sum of Scaled Scores Composite Score Percentile Rank 95% Conf. Interval Qualitative Description  Verbal Comprehension 19 VCI 80 9 75-86 Low Average  Perceptual Reasoning 22 PRI 84 14 79-91 Low Average  Working Memory 15 WMI 86 18 80-94 Low Average  Processing Speed 12 PSI 79 8 73-89 Borderline  Full Scale 68 FSIQ 78 7 74-83 Borderline  General Ability 41 GAI 80 9 76-86 Low Average   Index Level Discrepancy Comparisons  Comparison Score 1 Score 2 Difference Critical Value .05 Significant Difference Y/N Base Rate by Overall Sample  VCI - PRI 80 84 -4 8.31 N 39.7  VCI - WMI 80 86 -6 8.82 N 33.0  VCI - PSI 80 79 1 10.19 N 48.1  PRI - WMI 84 86 -2 9.74 N 45.0  PRI - PSI 84 79 5 11.00 N 37.0  WMI - PSI 86 79 7 11.38 N 30.6  FSIQ - GAI 78 80 -2 3.51 N 37.6   Brief Cognitive Status Exam Classification  Age Years of Education Raw Score Classification Level Base Rate  63 years 3 months 14 42 Low 3.5    Index Score Summary  Index Sum of Scaled Scores Index Score Percentile Rank 95% Confidence Interval Qualitative Descriptor  Auditory Memory (AMI) 32 88 21 82-95 Low Average  Visual Memory (VMI) 35 92 30 87-98 Average  Visual Working Memory (VWMI) 14 83 13 77-91 Low Average  Immediate Memory (IMI) 31 84 14 79-91 Low Average  Delayed Memory (DMI) 36 93  32 87-100 Average   Primary Subtest Scaled Score Summary  Subtest Domain Raw Score Scaled Score Percentile Rank  Logical Memory I AM 14 5 5   Logical Memory II AM 13 7 16   Verbal Paired Associates I AM 25 9 37  Verbal Paired Associates II AM 10 11 63  Designs I VM 46 6 9  Designs II VM 46 9 37  Visual Reproduction I VM 35 11 63  Visual Reproduction II VM 18 9 37  Spatial Addition VWM 10 9 37  Symbol Span VWM 10 5 5     PROCESS SCORE CONVERSIONS  Auditory Memory Process Score Summary  Process Score Raw Score Scaled Score Percentile Rank Cumulative Percentage (Base Rate)  LM II Recognition 21 - - 10-16%  VPA II Recognition 40 - - >75%   Visual Memory Process Score Summary  Process Score Raw Score Scaled Score Percentile Rank Cumulative Percentage (Base Rate)  DE I Content 34 10 50 -  DE I Spatial 10 5 5  -  DE II Content 33 10 50 -  DE II Spatial 9 8 25  -  DE II Recognition 14 - - 26-50%  VR II Recognition 5 - - 26-50%

## 2019-03-11 ENCOUNTER — Telehealth: Payer: Self-pay

## 2019-03-11 ENCOUNTER — Telehealth: Payer: Self-pay | Admitting: Neurology

## 2019-03-11 NOTE — Telephone Encounter (Signed)
Pt called in wanting to know if it is ok to start taking memory medication

## 2019-03-11 NOTE — Telephone Encounter (Signed)
I called pt. He has an appt on 03/18/19 with Dr. Rexene Alberts. He reports that he has completed neuro psych testing but has not gotten his results. I recommended that we keep the appt on 03/18/19 for his auto pap and discuss memory medications at that time as well, if neuro psych results are available. Pt verbalized understanding of recommendations. Pt had no questions at this time but was encouraged to call back if questions arise.

## 2019-03-11 NOTE — Telephone Encounter (Signed)
Ptn left voicemail that he has questions about visit - please return call

## 2019-03-18 ENCOUNTER — Ambulatory Visit (INDEPENDENT_AMBULATORY_CARE_PROVIDER_SITE_OTHER): Payer: Medicare Other | Admitting: Neurology

## 2019-03-18 ENCOUNTER — Encounter: Payer: Self-pay | Admitting: Neurology

## 2019-03-18 ENCOUNTER — Other Ambulatory Visit: Payer: Self-pay

## 2019-03-18 VITALS — BP 110/61 | HR 61 | Ht 68.0 in | Wt 183.0 lb

## 2019-03-18 DIAGNOSIS — Z9989 Dependence on other enabling machines and devices: Secondary | ICD-10-CM | POA: Diagnosis not present

## 2019-03-18 DIAGNOSIS — R413 Other amnesia: Secondary | ICD-10-CM | POA: Diagnosis not present

## 2019-03-18 DIAGNOSIS — R55 Syncope and collapse: Secondary | ICD-10-CM

## 2019-03-18 DIAGNOSIS — G4733 Obstructive sleep apnea (adult) (pediatric): Secondary | ICD-10-CM

## 2019-03-18 NOTE — Patient Instructions (Addendum)
As far as I can tell, you were supposed to have a cardiac event monitor done through cardiology.  Please check with your cardiologist whether or not this test is still recommended.  I see where it was ordered. You have done a great job using your AutoPap machine.  Please keep up the good work, I am glad you are feeling better and less sleepy during the day. Please try to get in touch with your DME company, [employer] for a mask refit.  They may be able to fit you with a newer style full facemask; your current nasal mask is leaking too much, Which can cause mouth dryness.  It also causes somewhat Suboptimal control of your sleep apnea at times. Please also call Dr. Ferne Coe office for rescheduling your testing with him. His number is [phone].
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
## 2019-03-18 NOTE — Progress Notes (Signed)
Epworth Sleepiness Scale 0= would never doze 1= slight chance of dozing 2= moderate chance of dozing 3= high chance of dozing  Sitting and reading: 0 Watching TV: 1 Sitting inactive in a public place (ex. Theater or meeting): 1 As a passenger in a car for an hour without a break: 0 Lying down to rest in the afternoon: 3 Sitting and talking to someone: 1 Sitting quietly after lunch (no alcohol): 0 In a car, while stopped in traffic: 0 Total: 6

## 2019-03-18 NOTE — Progress Notes (Signed)
Order for mask refit sent to [facility] via fax. Confirmation received that the order transmitted was successful.

## 2019-03-28 ENCOUNTER — Encounter: Payer: Medicare Other | Attending: Psychology | Admitting: Psychology

## 2019-03-28 ENCOUNTER — Other Ambulatory Visit: Payer: Self-pay

## 2019-03-28 DIAGNOSIS — G4733 Obstructive sleep apnea (adult) (pediatric): Secondary | ICD-10-CM | POA: Diagnosis present

## 2019-03-28 DIAGNOSIS — R55 Syncope and collapse: Secondary | ICD-10-CM | POA: Diagnosis present

## 2019-03-28 DIAGNOSIS — R413 Other amnesia: Secondary | ICD-10-CM | POA: Diagnosis not present

## 2019-03-28 DIAGNOSIS — S14109S Unspecified injury at unspecified level of cervical spinal cord, sequela: Secondary | ICD-10-CM | POA: Insufficient documentation

## 2019-03-28 NOTE — Progress Notes (Signed)
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 Raw Score  Process Score Range Raw Score  Block Design 0-66 32  Block Design No Time Bonus 0-48 32  Similarities 0-36 20  Digit Span Forward 0-16 7  Digit Span 0-48 22  Digit Span Backward 0-16 6  Matrix Reasoning 0-26 9  Digit Span Sequencing 0-16 9  Vocabulary 0-57 17  Longest Digit Span Forward 0, 2-9 5  Arithmetic 0-22 11  Longest Digit Span Backward 0, 2-8 3  Symbol Search 0-60 21  Longest Digit Span Sequence 0, 2-9 6  Visual Puzzles 0-26 8  Longest Letter-Number Seq. 0, 2-8 4  Information 0-26 7      Coding 0-135 35      Letter-Number Seq. 0-30 16      Figure Weights 0-27 7      Comprehension 0-36 20      Cancellation 0-72 26      Picture Completion 0-24 9          WMS-IV Wechsler Memory Scale-Fourth Edition Score Report   Examinee Name Steven Foster  Date of Report 2019/03/28  Kingsley Spittle ID [id number]  Years of Education 12  Date of Birth [birth year]  Home Language   Gender Male  Handedness Not  Specified  Race/Ethnicity   Examiner Name Ilean Skill  Date of Testing 2019/02/25  Age at Testing 8 years 3 months  Retest? No   Comments: Brief Cognitive Status Exam Classification  Age Years of Education Raw Score Classification Level Base Rate  63 years 3 months 12 42 Low 3.5    Index Score Summary  Index Sum of Scaled Scores Index Score Percentile Rank 95% Confidence Interval Qualitative Descriptor  Auditory Memory (AMI) 32 88 21 82-95 Low Average  Visual Memory (VMI) 35 92 30 87-98 Average  Visual Working Memory (VWMI) 14 83 13 77-91 Low Average  Immediate Memory (IMI) 31 84 14 79-91 Low Average  Delayed Memory (DMI) 36 93 32 87-100 Average    Index Score Profile  The vertical bars represent the standard error of measurement (SEM).   Primary Subtest Scaled Score Summary  Subtest Domain Raw Score Scaled Score Percentile Rank  Logical Memory I AM 14 5 5   Logical Memory II AM 13 7 16   Verbal Paired Associates I AM 25 9 37  Verbal [employer]ired Associates II AM 10 11 63  Designs I VM 46 6 9  Designs II VM 46 9 37  Visual Reproduction I VM 35 11 63  Visual Reproduction II VM 18 9 37  Spatial Addition VWM 10 9 37  Symbol Span VWM 10 5 5    Primary Subtest Scaled Score Profile    PROCESS SCORE CONVERSIONS  Auditory Memory Process Score Summary  Process Score Raw Score Scaled Score Percentile Rank Cumulative Percentage (Base Rate)  LM II Recognition 21 - - 10-16%  VPA II Recognition 40 - - >75%   Visual Memory Process Score Summary  Process Score Raw Score Scaled Score Percentile Rank Cumulative Percentage (Base Rate)  DE I Content 34 10 50 -  DE I Spatial 10 5 5  -  DE II Content 33 10 50 -  DE II Spatial 9 8 25  -  DE II Recognition 14 - - 26-50%  VR II Recognition 5 - - 26-50%    SUBTEST-LEVEL DIFFERENCES WITHIN INDEXES  Auditory Memory Index  Subtest Scaled Score AMI Mean Score Difference from Mean Critical Value Base Rate  Logical Memory I 5 8.00 -3.00  2.64 5-10%  Logical Memory II 7 8.00 -1.00 2.48 >25%  Verbal Paired Associates I 9 8.00 1.00 1.90 >25%  Verbal Paired Associates II 11 8.00 3.00 2.48 10%  Statistical significance (critical value) at the .05 level.   Visual Memory Index  Subtest Scaled Score VMI Mean Score Difference from Mean Critical Value Base Rate  Designs I 6 8.75 -2.75 2.38 10%  Designs II 9 8.75 0.25 2.38 >25%  Visual Reproduction I 11 8.75 2.25 1.86 25%  Visual Reproduction II 9 8.75 0.25 1.48 >25%  Statistical significance (critical value) at the .05 level.   Immediate Memory Index  Subtest Scaled Score IMI Mean Score Difference from Mean Critical Value Base Rate  Logical Memory I 5 7.75 -2.75 2.59 15-25%  Verbal Paired Associates I 9 7.75 1.25 1.82 >25%  Designs I 6 7.75 -1.75 2.42 >25%  Visual Reproduction I 11 7.75 3.25 1.91 10-15%  Statistical significance (critical value) at the .05 level.   Delayed Memory Index  Subtest Scaled Score DMI Mean Score Difference from Mean Critical Value Base Rate  Logical Memory II 7 9.00 -2.00 2.44 >25%  Verbal [employer] Associates II 11 9.00 2.00 2.44 >25%  Designs II 9 9.00 0.00 2.44 >25%  Visual Reproduction II 9 9.00 0.00 1.57 >25%  Statistical significance (critical value) at the .05 level.    SUBTEST DISCREPANCY COMPARISON  Comparison Score 1 Score 2 Difference Critical Value Base Rate  Spatial Addition - Symbol Span 9 5 4  2.74 25.9  Statistical significance (critical value) at the .05 level.    SUBTEST-LEVEL CONTRAST SCALED SCORES  Logical Memory  Score Score 1 Score 2 Contrast Scaled Score  LM II Recognition vs. Delayed Recall 10-16% 7 10  LM Immediate Recall vs. Delayed Recall 5 7 12    Verbal Paired Associates  Score Score 1 Score 2 Contrast Scaled Score  VPA II Recognition vs. Delayed Recall >75% 11 9  VPA Immediate Recall vs. Delayed Recall 9 11 13    Designs  Score Score 1 Score 2 Contrast Scaled Score  DE I Spatial vs. Content 5 10 12   DE  II Spatial vs. Content 8 10 11   DE II Recognition vs. Delayed Recall 26-50% 9 10  DE Immediate Recall vs. Delayed Recall 6 9 14    Visual Reproduction  Score Score 1 Score 2 Contrast Scaled Score  VR II Recognition vs. Delayed Recall 26-50% 9 10  VR Immediate Recall vs. Delayed Recall 11 9 8     INDEX-LEVEL CONTRAST SCALED SCORES  WMS-IV Indexes  Score Score 1 Score 2 Contrast Scaled Score  Auditory Memory Index vs. Visual Memory Index 88 92 9  Visual Working Memory Index vs. Visual Memory Index 83 92 11  Immediate Memory Index vs. Delayed Memory Index 84 93 13    RAW SCORES  Subtest Score Range Adult Score Range Older Adult Raw Score  Brief Cognitive Status Exam 0-58 0-58 42  Logical Memory I 0-50 0-53 14  Logical Memory II 0-50 0-39 13  Verbal Paired Associates I 0-56 0-40 25  Verbal Paired Associates II 0-14 0-10 10  CVLT-II Trials 1-5 5-95 5-95   CVLT-II Long-Delay -5-5 -5-5   Designs I 0-120  46  Designs II 0-120  46  Visual Reproduction I 0-43 0-43 35  Visual Reproduction II 0-43 0-43 18  Spatial Addition 0-24  10  Symbol Span 0-50 0-50 10  Process Score Range Adult Score Range Older Adult Raw Score  LM II Recognition 0-30 0-23 21  VPA II Recognition 0-40 0-30 40  VPA II Word Recall 0-28 0-20   DE I Content 0-48  34  DE I Spatial 0-24  10  DE II Content 0-48  33  DE II Spatial 0-24  9  DE II Recognition 0-24  14  VR II Recognition 0-7 0-7 5  VR II Copy 0-43 0-43     ABILITY-MEMORY ANALYSIS  Ability Score:  GAI: 80 Date of Testing:  WAIS-IV; WMS-IV 2019/02/25  Predicted Difference Method   Index Predicted WMS-IV Index Score Actual WMS-IV Index Score Difference Critical Value  Significant Difference Y/N Base Rate  Auditory Memory 89 88 1 8.95 N   Visual Memory 88 92 -4 8.82 N   Visual Working Memory 87 83 4 11.24 N   Immediate Memory 87 84 3 10.35 N   Delayed Memory 88 93 -5 10.08 N   Statistical significance (critical value) at the .01 level.     Contrast Scaled Scores  Score Score 1 Score 2 Contrast Scaled Score  General Ability Index vs. Auditory Memory Index 80 88 9  General Ability Index vs. Visual Memory Index 80 92 11  General Ability Index vs. Visual Working Memory Index 80 83 9  General Ability Index vs. Immediate Memory Index 80 84 9  General Ability Index vs. Delayed Memory Index 80 93 10  Verbal Comprehension Index vs. Auditory Memory Index 80 88 9  Perceptual Reasoning Index vs. Visual Memory Index 84 92 11  Perceptual Reasoning Index vs. Visual Working Memory Index 84 83 9  Working Memory Index vs. Auditory Memory Index 86 88 8  Working Memory Index vs. Visual Working Memory Index 86 83 8    End of Report

## 2019-03-29 NOTE — Telephone Encounter (Signed)
Left message on Pts answering machine. I included the date of his upcoming feedback session with Dr. Sima Matas (9/17).

## 2019-04-01 ENCOUNTER — Ambulatory Visit (INDEPENDENT_AMBULATORY_CARE_PROVIDER_SITE_OTHER): Payer: Medicare Other | Admitting: Cardiology

## 2019-04-01 ENCOUNTER — Other Ambulatory Visit: Payer: Self-pay

## 2019-04-01 ENCOUNTER — Encounter: Payer: Self-pay | Admitting: Cardiology

## 2019-04-01 VITALS — BP 110/61 | HR 76 | Temp 97.5°F | Ht 68.0 in | Wt 181.0 lb

## 2019-04-01 DIAGNOSIS — R55 Syncope and collapse: Secondary | ICD-10-CM | POA: Diagnosis not present
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
## 2019-04-01 NOTE — Patient Instructions (Addendum)
Medication Instructions:  NO CHANGES   Lab work:  NOT NEEDED.  Testing/Procedures: NOT NEEDED  Follow-Up: At [facility], you and your health needs are our priority.  As part of our continuing mission to provide you with exceptional heart care, we have created designated Provider Care Teams.  These Care Teams include your primary Cardiologist (physician) and Advanced Practice Providers (APPs -  Physician Assistants and Nurse Practitioners) who all work together to provide you with the care you need, when you need it. You will need a follow up appointment ON AN AS NEEDED BASIS. CONTACT OFFICE IF YOU HAVE ANOTHER EPISODE WE WILL POSSIBLE IMPLANT  A LOOP RECORDER        Any Other Special Instructions Will Be Listed Below (If Applicable).

## 2019-04-02 ENCOUNTER — Other Ambulatory Visit: Payer: Self-pay | Admitting: Neurology

## 2019-04-02 DIAGNOSIS — S14105S Unspecified injury at C5 level of cervical spinal cord, sequela: Secondary | ICD-10-CM

## 2019-04-02 DIAGNOSIS — G8250 Quadriplegia, unspecified: Secondary | ICD-10-CM

## 2019-04-03 ENCOUNTER — Encounter: Payer: Self-pay | Admitting: Cardiology
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
## 2019-04-04 ENCOUNTER — Encounter (HOSPITAL_BASED_OUTPATIENT_CLINIC_OR_DEPARTMENT_OTHER): Payer: Medicare Other | Admitting: Psychology

## 2019-04-04 ENCOUNTER — Other Ambulatory Visit: Payer: Self-pay

## 2019-04-04 ENCOUNTER — Encounter: Payer: Self-pay | Admitting: Psychology

## 2019-04-04 DIAGNOSIS — G4733 Obstructive sleep apnea (adult) (pediatric): Secondary | ICD-10-CM | POA: Diagnosis not present

## 2019-04-04 DIAGNOSIS — R413 Other amnesia: Secondary | ICD-10-CM
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
## 2019-04-16 ENCOUNTER — Ambulatory Visit: Payer: Medicare Other | Admitting: Psychology

## 2019-04-19 ENCOUNTER — Ambulatory Visit: Payer: Medicare Other | Admitting: Psychology

## 2019-05-06 ENCOUNTER — Telehealth: Payer: Self-pay | Admitting: Neurology

## 2019-05-06 NOTE — Telephone Encounter (Signed)
Pt called wanting to know if he has to wait till his scheduled appt to get his licence back. Please advise.

## 2019-05-06 NOTE — Telephone Encounter (Signed)
I called pt to discuss. No answer, left a message asking him to call me back. 

## 2019-05-06 NOTE — Telephone Encounter (Signed)
I don't know, what he needs to do. He may want to call the DMV and find out, what he has to do next.

## 2019-05-06 NOTE — Telephone Encounter (Signed)
I called pt again to discuss. No answer, left a message asking him to call me back. 

## 2019-05-06 NOTE — Telephone Encounter (Signed)
Pt has returned the call to RN Kristen, please call °
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
## 2019-05-07 NOTE — Telephone Encounter (Signed)
I called pt again to discuss. No answer, left a message asking him to call me back. 

## 2019-05-08 NOTE — Telephone Encounter (Signed)
I called pt and advised him that we have sent a letter to the [facility] for driving clearance. Pt verbalized understanding and appreciation.

## 2019-05-08 NOTE — Telephone Encounter (Signed)
Pt called office, I was asked to take the call. Pt reports that he called the [facility] and they need Dr. Rexene Alberts to provide a letter saying if he is allowed to drive and what restrictions he may have. They also need all of his records from the [facility] and testing. This should all be faxed to [phone].

## 2019-05-08 NOTE — Telephone Encounter (Signed)
Please furnish a letter of support to be sent to the [facility]:  Patient has been diagnosed with obstructive sleep apnea in the mild to moderate range, he had a sleep study on 12/30/2018. He has established treatment at home with AutoPAP and is compliant with treatment. He indicates good results, and Reports reduction in his daytime sleepiness.  He was seen in follow-up on 03/18/2019 and was compliant with his treatment.  He has been adhering to my  recommendations. From the neurological standpoint I see no restriction for driving. He is cleared to drive.

## 2019-06-19 ENCOUNTER — Telehealth: Payer: Self-pay | Admitting: Neurology

## 2019-06-19 NOTE — Telephone Encounter (Signed)
I did not call pt. 

## 2019-06-19 NOTE — Telephone Encounter (Signed)
Pt states he missed a call by someone with a name beginning with a S.  Pt is unsure of the reason for the call.  Please call pt if RN Lovey Newcomer called

## 2019-06-23 ENCOUNTER — Encounter: Payer: Self-pay | Admitting: Neurology

## 2019-06-25 ENCOUNTER — Ambulatory Visit: Payer: Medicare Other | Admitting: Neurology

## 2019-06-25 ENCOUNTER — Other Ambulatory Visit: Payer: Self-pay

## 2019-06-25 ENCOUNTER — Encounter: Payer: Self-pay | Admitting: Neurology

## 2019-06-25 VITALS — BP 118/73 | HR 62 | Ht 68.0 in | Wt 186.0 lb

## 2019-06-25 DIAGNOSIS — R413 Other amnesia: Secondary | ICD-10-CM

## 2019-06-25 DIAGNOSIS — G4733 Obstructive sleep apnea (adult) (pediatric): Secondary | ICD-10-CM

## 2019-06-25 DIAGNOSIS — G8250 Quadriplegia, unspecified: Secondary | ICD-10-CM | POA: Diagnosis not present

## 2019-06-25 DIAGNOSIS — R419 Unspecified symptoms and signs involving cognitive functions and awareness: Secondary | ICD-10-CM

## 2019-06-25 DIAGNOSIS — Z9989 Dependence on other enabling machines and devices: Secondary | ICD-10-CM

## 2019-06-25 NOTE — Progress Notes (Signed)
Order for mask refit sent to [facility] via fax. Confirmation received that the order transmitted was successful.
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
## 2019-06-25 NOTE — Patient Instructions (Signed)
Keep up the good work with your AutoPap therapy, try to achieve 7 to 8 hours of sleep with it.  You may need a mask refit with [employer], for now, let us try a chinstrap at night, I will prescribe 1 and [employer]'s should send you 1.  Please do not drive when you feel sleepy.  We will continue to monitor your memory and your examination, follow-up in 6 months to see the nurse practitioner.

## 2019-08-09 ENCOUNTER — Other Ambulatory Visit: Payer: Self-pay | Admitting: Neurology

## 2019-08-09 DIAGNOSIS — G8250 Quadriplegia, unspecified: Secondary | ICD-10-CM

## 2019-08-09 DIAGNOSIS — S14105S Unspecified injury at C5 level of cervical spinal cord, sequela: Secondary | ICD-10-CM

## 2019-09-09 ENCOUNTER — Emergency Department (HOSPITAL_COMMUNITY): Payer: Medicare Other

## 2019-09-09 ENCOUNTER — Other Ambulatory Visit: Payer: Self-pay

## 2019-09-09 ENCOUNTER — Observation Stay (HOSPITAL_COMMUNITY)
Admission: EM | Admit: 2019-09-09 | Discharge: 2019-09-11 | Disposition: A | Discharge status: Home / Self Care | Payer: Medicare Other | Attending: Internal Medicine | Admitting: Internal Medicine

## 2019-09-09 DIAGNOSIS — Z79899 Other long term (current) drug therapy: Secondary | ICD-10-CM | POA: Diagnosis not present

## 2019-09-09 DIAGNOSIS — I2510 Atherosclerotic heart disease of native coronary artery without angina pectoris: Secondary | ICD-10-CM | POA: Insufficient documentation

## 2019-09-09 DIAGNOSIS — R590 Localized enlarged lymph nodes: Secondary | ICD-10-CM | POA: Diagnosis not present

## 2019-09-09 DIAGNOSIS — Z20822 Contact with and (suspected) exposure to covid-19: Secondary | ICD-10-CM | POA: Diagnosis not present

## 2019-09-09 DIAGNOSIS — Z9989 Dependence on other enabling machines and devices: Secondary | ICD-10-CM

## 2019-09-09 DIAGNOSIS — R918 Other nonspecific abnormal finding of lung field: Secondary | ICD-10-CM | POA: Insufficient documentation

## 2019-09-09 DIAGNOSIS — G8254 Quadriplegia, C5-C7 incomplete: Secondary | ICD-10-CM

## 2019-09-09 DIAGNOSIS — R000 Tachycardia, unspecified: Secondary | ICD-10-CM | POA: Diagnosis not present

## 2019-09-09 DIAGNOSIS — R7989 Other specified abnormal findings of blood chemistry: Secondary | ICD-10-CM

## 2019-09-09 DIAGNOSIS — R7401 Elevation of levels of liver transaminase levels: Secondary | ICD-10-CM | POA: Diagnosis present

## 2019-09-09 DIAGNOSIS — E7800 Pure hypercholesterolemia, unspecified: Secondary | ICD-10-CM | POA: Diagnosis not present

## 2019-09-09 DIAGNOSIS — R112 Nausea with vomiting, unspecified: Secondary | ICD-10-CM | POA: Insufficient documentation

## 2019-09-09 DIAGNOSIS — Z7901 Long term (current) use of anticoagulants: Secondary | ICD-10-CM | POA: Insufficient documentation

## 2019-09-09 DIAGNOSIS — R05 Cough: Secondary | ICD-10-CM | POA: Diagnosis not present

## 2019-09-09 DIAGNOSIS — R7982 Elevated C-reactive protein (CRP): Secondary | ICD-10-CM | POA: Diagnosis not present

## 2019-09-09 DIAGNOSIS — J90 Pleural effusion, not elsewhere classified: Secondary | ICD-10-CM | POA: Diagnosis not present

## 2019-09-09 DIAGNOSIS — Z87440 Personal history of urinary (tract) infections: Secondary | ICD-10-CM | POA: Insufficient documentation

## 2019-09-09 DIAGNOSIS — J189 Pneumonia, unspecified organism: Secondary | ICD-10-CM | POA: Diagnosis not present

## 2019-09-09 DIAGNOSIS — Z8249 Family history of ischemic heart disease and other diseases of the circulatory system: Secondary | ICD-10-CM | POA: Insufficient documentation

## 2019-09-09 DIAGNOSIS — R0602 Shortness of breath: Secondary | ICD-10-CM | POA: Insufficient documentation

## 2019-09-09 DIAGNOSIS — Z8349 Family history of other endocrine, nutritional and metabolic diseases: Secondary | ICD-10-CM | POA: Diagnosis not present

## 2019-09-09 DIAGNOSIS — R748 Abnormal levels of other serum enzymes: Secondary | ICD-10-CM | POA: Insufficient documentation

## 2019-09-09 DIAGNOSIS — G4733 Obstructive sleep apnea (adult) (pediatric): Secondary | ICD-10-CM | POA: Diagnosis not present

## 2019-09-09 DIAGNOSIS — E785 Hyperlipidemia, unspecified: Secondary | ICD-10-CM | POA: Diagnosis not present

## 2019-09-09 DIAGNOSIS — R509 Fever, unspecified: Secondary | ICD-10-CM | POA: Diagnosis present

## 2019-09-09 DIAGNOSIS — Z880 Allergy status to penicillin: Secondary | ICD-10-CM | POA: Insufficient documentation

## 2019-09-09 DIAGNOSIS — Z902 Acquired absence of lung [part of]: Secondary | ICD-10-CM | POA: Insufficient documentation

## 2019-09-09 LAB — BASIC METABOLIC PANEL
Anion gap: 13 (ref 5–15)
BUN: 16 mg/dL (ref 8–23)
CO2: 24 mmol/L (ref 22–32)
Calcium: 9.4 mg/dL (ref 8.9–10.3)
Chloride: 101 mmol/L (ref 98–111)
Creatinine, Ser: 1 mg/dL (ref 0.61–1.24)
GFR calc Af Amer: >60 mL/min (ref >60)
GFR calc non Af Amer: >60 mL/min (ref >60)
Glucose, Bld: 114 mg/dL — ABNORMAL HIGH (ref 70–99)
Potassium: 4.2 mmol/L (ref 3.5–5.1)
Sodium: 138 mmol/L (ref 135–145)

## 2019-09-09 LAB — CBC
HCT: 45.1 % (ref 39.0–52.0)
Hemoglobin: 14.4 g/dL (ref 13.0–17.0)
MCH: 31.4 pg (ref 26.0–34.0)
MCHC: 31.9 g/dL (ref 30.0–36.0)
MCV: 98.3 fL (ref 80.0–100.0)
Platelets: 326 10*3/uL (ref 150–400)
RBC: 4.59 MIL/uL (ref 4.22–5.81)
RDW: 13.9 % (ref 11.5–15.5)
WBC: 22.1 10*3/uL — ABNORMAL HIGH (ref 4.0–10.5)
nRBC: 0 % (ref 0.0–0.2)

## 2019-09-09 LAB — LIPASE, BLOOD: Lipase: 76 U/L — ABNORMAL HIGH (ref 11–51)

## 2019-09-09 LAB — LACTIC ACID, PLASMA: Lactic Acid, Venous: 1.1 mmol/L (ref 0.5–1.9)

## 2019-09-09 LAB — COMPREHENSIVE METABOLIC PANEL
ALT: 454 U/L — ABNORMAL HIGH (ref 0–44)
AST: 326 U/L — ABNORMAL HIGH (ref 15–41)
Albumin: 2.5 g/dL — ABNORMAL LOW (ref 3.5–5.0)
Alkaline Phosphatase: 391 U/L — ABNORMAL HIGH (ref 38–126)
Anion gap: 13 (ref 5–15)
BUN: 16 mg/dL (ref 8–23)
CO2: 25 mmol/L (ref 22–32)
Calcium: 9.1 mg/dL (ref 8.9–10.3)
Chloride: 97 mmol/L — ABNORMAL LOW (ref 98–111)
Creatinine, Ser: 1 mg/dL (ref 0.61–1.24)
GFR calc Af Amer: >60 mL/min (ref >60)
GFR calc non Af Amer: >60 mL/min (ref >60)
Glucose, Bld: 115 mg/dL — ABNORMAL HIGH (ref 70–99)
Potassium: 4.4 mmol/L (ref 3.5–5.1)
Sodium: 135 mmol/L (ref 135–145)
Total Bilirubin: 5.8 mg/dL — ABNORMAL HIGH (ref 0.3–1.2)
Total Protein: 6.6 g/dL (ref 6.5–8.1)

## 2019-09-09 LAB — URINALYSIS, ROUTINE W REFLEX MICROSCOPIC
Glucose, UA: NEGATIVE mg/dL
Hgb urine dipstick: NEGATIVE
Ketones, ur: NEGATIVE mg/dL
Leukocytes,Ua: NEGATIVE
Nitrite: NEGATIVE
Protein, ur: NEGATIVE mg/dL
Specific Gravity, Urine: 1.015 (ref 1.005–1.030)
pH: 7 (ref 5.0–8.0)

## 2019-09-09 LAB — TROPONIN I (HIGH SENSITIVITY)
Troponin I (High Sensitivity): 17 ng/L (ref <18)
Troponin I (High Sensitivity): 30 ng/L — ABNORMAL HIGH (ref <18)

## 2019-09-09 LAB — POC SARS CORONAVIRUS 2 AG -  ED: SARS Coronavirus 2 Ag: NEGATIVE

## 2019-09-09 LAB — PROTIME-INR
INR: 1.1 (ref 0.8–1.2)
Prothrombin Time: 14 seconds (ref 11.4–15.2)

## 2019-09-09 LAB — SARS CORONAVIRUS 2 (TAT 6-24 HRS): SARS Coronavirus 2: NEGATIVE

## 2019-09-09 LAB — APTT: aPTT: 31 seconds (ref 24–36)

## 2019-09-09 MED ORDER — SODIUM CHLORIDE 0.9 % IV SOLN
2.0000 g | Freq: Once | INTRAVENOUS | Status: AC
  Administered 2019-09-09: 14:00:00 2 g via INTRAVENOUS
  Filled 2019-09-09: qty 2

## 2019-09-09 MED ORDER — SODIUM CHLORIDE 0.9 % IV SOLN: 1.0000 g | Freq: Once | INTRAVENOUS | Status: DC

## 2019-09-09 MED ORDER — SODIUM CHLORIDE 0.9 % IV SOLN
500.0000 mg | INTRAVENOUS | Status: DC
  Administered 2019-09-10: 500 mg via INTRAVENOUS
  Filled 2019-09-09 (×2): qty 500

## 2019-09-09 MED ORDER — SODIUM CHLORIDE 0.9 % IV SOLN: INTRAVENOUS | Status: DC

## 2019-09-09 MED ORDER — BACLOFEN 10 MG PO TABS
20.0000 mg | ORAL_TABLET | Freq: Four times a day (QID) | ORAL | Status: DC
  Administered 2019-09-09 – 2019-09-11 (×7): 20 mg via ORAL
  Filled 2019-09-09: qty 2
  Filled 2019-09-09: qty 1
  Filled 2019-09-09 (×7): qty 2

## 2019-09-09 MED ORDER — ENOXAPARIN SODIUM 40 MG/0.4ML ~~LOC~~ SOLN
40.0000 mg | SUBCUTANEOUS | Status: DC
  Administered 2019-09-09: 40 mg via SUBCUTANEOUS
  Filled 2019-09-09: qty 0.4

## 2019-09-09 MED ORDER — SODIUM CHLORIDE 0.9 % IV BOLUS
1000.0000 mL | Freq: Once | INTRAVENOUS | Status: AC
  Administered 2019-09-09: 1000 mL via INTRAVENOUS

## 2019-09-09 MED ORDER — AZITHROMYCIN 250 MG PO TABS
500.0000 mg | ORAL_TABLET | Freq: Once | ORAL | Status: AC
  Administered 2019-09-09: 14:00:00 500 mg via ORAL
  Filled 2019-09-09: qty 2

## 2019-09-09 MED ORDER — SODIUM CHLORIDE 0.9 % IV SOLN
2.0000 g | INTRAVENOUS | Status: DC
  Administered 2019-09-09 – 2019-09-10 (×2): 2 g via INTRAVENOUS
  Filled 2019-09-09: qty 2
  Filled 2019-09-09 (×2): qty 20

## 2019-09-09 NOTE — ED Provider Notes (Signed)
Signout from Dr. Ralene Bathe.  64 year old male here with fever and shortness of breath.  Work-up shows multifocal pneumonia.  LFTs are also elevated.  Point-of-care Covid testing negative.  Plan is to follow-up on right upper quadrant ultrasound and admission. Physical Exam  BP 127/77 (BP Location: Left Arm)   Pulse (!) 101   Temp (!) 100.4 F (38 C) (Oral)   Resp 20   Ht 5\' 8"  (1.727 m)   Wt 81.6 kg   SpO2 93%   BMI 27.37 kg/m   Physical Exam  ED Course/Procedures     Procedures  MDM  It ultimately took about 5 hours to get the right upper quadrant ultrasound done as the techs were waiting for the Covid test to result.  Right upper quadrant ultrasound does not show any acute findings.  Discussed with Dr. Posey Pronto from the hospitalist service will evaluate the patient for admission.      Hayden Rasmussen, MD 09/10/19 [id number]

## 2019-09-09 NOTE — ED Notes (Signed)
PT reports fever for past 3-4 Days. Pt reports his Family has tested positive for COVID 19  PT reports he has not been tested N/A exspouser to COVID was at the end of January.

## 2019-09-09 NOTE — ED Triage Notes (Signed)
Pt reports not feeling well the last few days. Today with fever to 103. Reports cough, SOB, generalized weakness. Diagnosed with COVID 1 month ago.
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
## 2019-09-10 ENCOUNTER — Observation Stay (HOSPITAL_COMMUNITY): Payer: Medicare Other

## 2019-09-10 ENCOUNTER — Other Ambulatory Visit: Payer: Self-pay

## 2019-09-10 ENCOUNTER — Encounter (HOSPITAL_COMMUNITY): Payer: Self-pay | Admitting: Internal Medicine

## 2019-09-10 DIAGNOSIS — E785 Hyperlipidemia, unspecified: Secondary | ICD-10-CM | POA: Diagnosis not present

## 2019-09-10 DIAGNOSIS — R7401 Elevation of levels of liver transaminase levels: Secondary | ICD-10-CM

## 2019-09-10 DIAGNOSIS — J189 Pneumonia, unspecified organism: Secondary | ICD-10-CM | POA: Diagnosis not present

## 2019-09-10 DIAGNOSIS — R7989 Other specified abnormal findings of blood chemistry: Secondary | ICD-10-CM | POA: Diagnosis not present

## 2019-09-10 DIAGNOSIS — Z9989 Dependence on other enabling machines and devices: Secondary | ICD-10-CM

## 2019-09-10 DIAGNOSIS — G4733 Obstructive sleep apnea (adult) (pediatric): Secondary | ICD-10-CM

## 2019-09-10 DIAGNOSIS — G8254 Quadriplegia, C5-C7 incomplete: Secondary | ICD-10-CM

## 2019-09-10 LAB — COMPREHENSIVE METABOLIC PANEL
ALT: 375 U/L — ABNORMAL HIGH (ref 0–44)
AST: 239 U/L — ABNORMAL HIGH (ref 15–41)
Albumin: 2.2 g/dL — ABNORMAL LOW (ref 3.5–5.0)
Alkaline Phosphatase: 367 U/L — ABNORMAL HIGH (ref 38–126)
Anion gap: 11 (ref 5–15)
BUN: 17 mg/dL (ref 8–23)
CO2: 23 mmol/L (ref 22–32)
Calcium: 8.8 mg/dL — ABNORMAL LOW (ref 8.9–10.3)
Chloride: 103 mmol/L (ref 98–111)
Creatinine, Ser: 0.85 mg/dL (ref 0.61–1.24)
GFR calc Af Amer: >60 mL/min (ref >60)
GFR calc non Af Amer: >60 mL/min (ref >60)
Glucose, Bld: 125 mg/dL — ABNORMAL HIGH (ref 70–99)
Potassium: 3.9 mmol/L (ref 3.5–5.1)
Sodium: 137 mmol/L (ref 135–145)
Total Bilirubin: 4.7 mg/dL — ABNORMAL HIGH (ref 0.3–1.2)
Total Protein: 5.9 g/dL — ABNORMAL LOW (ref 6.5–8.1)

## 2019-09-10 LAB — CBC
HCT: 38.9 % — ABNORMAL LOW (ref 39.0–52.0)
Hemoglobin: 12.8 g/dL — ABNORMAL LOW (ref 13.0–17.0)
MCH: 31.3 pg (ref 26.0–34.0)
MCHC: 32.9 g/dL (ref 30.0–36.0)
MCV: 95.1 fL (ref 80.0–100.0)
Platelets: 308 10*3/uL (ref 150–400)
RBC: 4.09 MIL/uL — ABNORMAL LOW (ref 4.22–5.81)
RDW: 14.1 % (ref 11.5–15.5)
WBC: 14.8 10*3/uL — ABNORMAL HIGH (ref 4.0–10.5)
nRBC: 0 % (ref 0.0–0.2)

## 2019-09-10 LAB — FERRITIN: Ferritin: 235 ng/mL (ref 24–336)

## 2019-09-10 LAB — ETHANOL: Alcohol, Ethyl (B): <10 mg/dL (ref <10)

## 2019-09-10 LAB — C-REACTIVE PROTEIN: CRP: 15.6 mg/dL — ABNORMAL HIGH (ref <1.0)

## 2019-09-10 LAB — HEPATITIS PANEL, ACUTE
HCV Ab: NONREACTIVE
Hep A IgM: NONREACTIVE
Hep B C IgM: NONREACTIVE
Hepatitis B Surface Ag: NONREACTIVE

## 2019-09-10 LAB — D-DIMER, QUANTITATIVE: D-Dimer, Quant: 2.44 ug/mL-FEU — ABNORMAL HIGH (ref 0.00–0.50)

## 2019-09-10 LAB — URINE CULTURE

## 2019-09-10 LAB — BRAIN NATRIURETIC PEPTIDE: B Natriuretic Peptide: 85.9 pg/mL (ref 0.0–100.0)

## 2019-09-10 LAB — INFLUENZA PANEL BY PCR (TYPE A & B)
Influenza A By PCR: NEGATIVE
Influenza B By PCR: NEGATIVE

## 2019-09-10 LAB — ACETAMINOPHEN LEVEL: Acetaminophen (Tylenol), Serum: <10 ug/mL — ABNORMAL LOW (ref 10–30)

## 2019-09-10 LAB — STREP PNEUMONIAE URINARY ANTIGEN: Strep Pneumo Urinary Antigen: NEGATIVE

## 2019-09-10 LAB — PROCALCITONIN: Procalcitonin: 1.52 ng/mL

## 2019-09-10 LAB — HIV ANTIBODY (ROUTINE TESTING W REFLEX): HIV Screen 4th Generation wRfx: NONREACTIVE

## 2019-09-10 MED ORDER — IOHEXOL 350 MG/ML SOLN
100.0000 mL | Freq: Once | INTRAVENOUS | Status: AC | PRN
  Administered 2019-09-10: 100 mL via INTRAVENOUS

## 2019-09-10 MED ORDER — FLUTICASONE PROPIONATE 50 MCG/ACT NA SUSP
2.0000 | Freq: Every day | NASAL | Status: DC
  Administered 2019-09-10 – 2019-09-11 (×2): 2 via NASAL
  Filled 2019-09-10: qty 16

## 2019-09-10 MED ORDER — ENOXAPARIN SODIUM 40 MG/0.4ML ~~LOC~~ SOLN
40.0000 mg | Freq: Two times a day (BID) | SUBCUTANEOUS | Status: DC
  Administered 2019-09-10 – 2019-09-11 (×3): 40 mg via SUBCUTANEOUS
  Filled 2019-09-10 (×3): qty 0.4

## 2019-09-10 MED ORDER — ALBUTEROL SULFATE (2.5 MG/3ML) 0.083% IN NEBU: 3.0000 mL | INHALATION_SOLUTION | RESPIRATORY_TRACT | Status: DC | PRN

## 2019-09-10 MED ORDER — LORATADINE 10 MG PO TABS
10.0000 mg | ORAL_TABLET | Freq: Every day | ORAL | Status: DC
  Administered 2019-09-10 – 2019-09-11 (×2): 10 mg via ORAL
  Filled 2019-09-10 (×2): qty 1

## 2019-09-10 MED ORDER — GUAIFENESIN ER 600 MG PO TB12
1200.0000 mg | ORAL_TABLET | Freq: Two times a day (BID) | ORAL | Status: DC
  Administered 2019-09-10 – 2019-09-11 (×2): 1200 mg via ORAL
  Filled 2019-09-10 (×3): qty 2

## 2019-09-10 MED ORDER — ALBUTEROL SULFATE (2.5 MG/3ML) 0.083% IN NEBU
3.0000 mL | INHALATION_SOLUTION | Freq: Three times a day (TID) | RESPIRATORY_TRACT | Status: DC
  Administered 2019-09-10: 3 mL via RESPIRATORY_TRACT
  Filled 2019-09-10: qty 3

## 2019-09-10 MED ORDER — PANTOPRAZOLE SODIUM 40 MG PO TBEC
40.0000 mg | DELAYED_RELEASE_TABLET | Freq: Every day | ORAL | Status: DC
  Administered 2019-09-10 – 2019-09-11 (×2): 40 mg via ORAL
  Filled 2019-09-10 (×2): qty 1

## 2019-09-10 MED ORDER — TRAMADOL HCL 50 MG PO TABS
50.0000 mg | ORAL_TABLET | Freq: Four times a day (QID) | ORAL | Status: DC | PRN
  Administered 2019-09-10 – 2019-09-11 (×2): 50 mg via ORAL
  Filled 2019-09-10 (×2): qty 1
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
## 2019-09-11 DIAGNOSIS — R7989 Other specified abnormal findings of blood chemistry: Secondary | ICD-10-CM | POA: Diagnosis not present

## 2019-09-11 DIAGNOSIS — R509 Fever, unspecified: Secondary | ICD-10-CM | POA: Diagnosis not present

## 2019-09-11 DIAGNOSIS — J189 Pneumonia, unspecified organism: Secondary | ICD-10-CM | POA: Diagnosis not present

## 2019-09-11 LAB — CBC WITH DIFFERENTIAL/PLATELET
Abs Immature Granulocytes: 0.23 10*3/uL — ABNORMAL HIGH (ref 0.00–0.07)
Basophils Absolute: 0.1 10*3/uL (ref 0.0–0.1)
Basophils Relative: 1 %
Eosinophils Absolute: 0.4 10*3/uL (ref 0.0–0.5)
Eosinophils Relative: 4 %
HCT: 38.4 % — ABNORMAL LOW (ref 39.0–52.0)
Hemoglobin: 12.8 g/dL — ABNORMAL LOW (ref 13.0–17.0)
Immature Granulocytes: 2 %
Lymphocytes Relative: 19 %
Lymphs Abs: 2 10*3/uL (ref 0.7–4.0)
MCH: 31.1 pg (ref 26.0–34.0)
MCHC: 33.3 g/dL (ref 30.0–36.0)
MCV: 93.4 fL (ref 80.0–100.0)
Monocytes Absolute: 0.8 10*3/uL (ref 0.1–1.0)
Monocytes Relative: 8 %
Neutro Abs: 7.4 10*3/uL (ref 1.7–7.7)
Neutrophils Relative %: 66 %
Platelets: 327 10*3/uL (ref 150–400)
RBC: 4.11 MIL/uL — ABNORMAL LOW (ref 4.22–5.81)
RDW: 14.1 % (ref 11.5–15.5)
WBC: 11 10*3/uL — ABNORMAL HIGH (ref 4.0–10.5)
nRBC: 0 % (ref 0.0–0.2)

## 2019-09-11 LAB — COMPREHENSIVE METABOLIC PANEL
ALT: 308 U/L — ABNORMAL HIGH (ref 0–44)
AST: 159 U/L — ABNORMAL HIGH (ref 15–41)
Albumin: 2.2 g/dL — ABNORMAL LOW (ref 3.5–5.0)
Alkaline Phosphatase: 326 U/L — ABNORMAL HIGH (ref 38–126)
Anion gap: 11 (ref 5–15)
BUN: 14 mg/dL (ref 8–23)
CO2: 25 mmol/L (ref 22–32)
Calcium: 9.2 mg/dL (ref 8.9–10.3)
Chloride: 104 mmol/L (ref 98–111)
Creatinine, Ser: 0.81 mg/dL (ref 0.61–1.24)
GFR calc Af Amer: >60 mL/min (ref >60)
GFR calc non Af Amer: >60 mL/min (ref >60)
Glucose, Bld: 90 mg/dL (ref 70–99)
Potassium: 4 mmol/L (ref 3.5–5.1)
Sodium: 140 mmol/L (ref 135–145)
Total Bilirubin: 1.7 mg/dL — ABNORMAL HIGH (ref 0.3–1.2)
Total Protein: 6.3 g/dL — ABNORMAL LOW (ref 6.5–8.1)

## 2019-09-11 MED ORDER — DOXYCYCLINE HYCLATE 100 MG PO TABS: 100.0000 mg | ORAL_TABLET | Freq: Two times a day (BID) | ORAL | 0 refills | Status: DC

## 2019-09-11 MED ORDER — DOXYCYCLINE HYCLATE 100 MG PO TABS
100.0000 mg | ORAL_TABLET | Freq: Two times a day (BID) | ORAL | Status: DC
  Administered 2019-09-11: 100 mg via ORAL
  Filled 2019-09-11: qty 1

## 2019-09-11 MED ORDER — GUAIFENESIN ER 600 MG PO TB12: 1200.0000 mg | ORAL_TABLET | Freq: Two times a day (BID) | ORAL | Status: AC | PRN
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
## 2019-09-11 NOTE — Care Management Obs Status (Signed)
[facility] NOTIFICATION   Patient Details  Name: Steven Foster MRN: [id number] Date of Birth: 14-Jan-1956   Medicare Observation Status Notification Given:  Yes    Carles Collet, RN 09/11/2019, 9:20 AM

## 2019-09-11 NOTE — Progress Notes (Signed)
Patient Saturations on Room Air at Rest 94%. Patient Saturations on Room Air while Ambulating 93%.  Patient did not require oxygen during this activity, he maintained his saturation with no c/o SOB or dyspnea during ambulation.

## 2019-09-11 NOTE — Progress Notes (Signed)
Patient uses refused CPAP tonight due to  Chest congestion.

## 2019-09-11 NOTE — Progress Notes (Signed)
Home O2 eval pending-- if sats remain >88% plan to d/c home. JV

## 2019-09-12 LAB — LEGIONELLA PNEUMOPHILA SEROGP 1 UR AG: L. pneumophila Serogp 1 Ur Ag: NEGATIVE

## 2019-09-14 LAB — CULTURE, BLOOD (ROUTINE X 2)
Culture: NO GROWTH
Special Requests: ADEQUATE

## 2019-09-16 LAB — CULTURE, BLOOD (ROUTINE X 2)

## 2019-09-23 ENCOUNTER — Ambulatory Visit: Payer: Medicare Other | Admitting: Orthopaedic Surgery

## 2019-09-23 ENCOUNTER — Ambulatory Visit: Payer: Self-pay

## 2019-09-23 ENCOUNTER — Encounter: Payer: Self-pay | Admitting: Orthopaedic Surgery

## 2019-09-23 ENCOUNTER — Other Ambulatory Visit: Payer: Self-pay

## 2019-09-23 VITALS — Ht 68.0 in | Wt 178.0 lb

## 2019-09-23 DIAGNOSIS — M25512 Pain in left shoulder: Secondary | ICD-10-CM | POA: Diagnosis not present

## 2019-09-23 MED ORDER — BUPIVACAINE HCL 0.5 % IJ SOLN
3.0000 mL | INTRAMUSCULAR | Status: AC | PRN
  Administered 2019-09-23: 3 mL via INTRA_ARTICULAR

## 2019-09-23 MED ORDER — LIDOCAINE HCL 1 % IJ SOLN
3.0000 mL | INTRAMUSCULAR | Status: AC | PRN
  Administered 2019-09-23: 3 mL

## 2019-09-23 MED ORDER — METHYLPREDNISOLONE ACETATE 40 MG/ML IJ SUSP
40.0000 mg | INTRAMUSCULAR | Status: AC | PRN
  Administered 2019-09-23: 40 mg via INTRA_ARTICULAR

## 2019-09-23 NOTE — Addendum Note (Signed)
Addended by: Lendon Collar on: 09/23/2019 02:52 PM   Modules accepted: Orders
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
## 2019-09-27 ENCOUNTER — Ambulatory Visit: Payer: Medicare Other | Admitting: Orthopaedic Surgery

## 2019-10-23 ENCOUNTER — Ambulatory Visit
Admission: RE | Admit: 2019-10-23 | Discharge: 2019-10-23 | Disposition: A | Discharge status: Home / Self Care | Payer: Medicare Other | Source: Ambulatory Visit | Attending: Orthopaedic Surgery | Admitting: Orthopaedic Surgery

## 2019-10-23 ENCOUNTER — Other Ambulatory Visit: Payer: Self-pay

## 2019-10-23 DIAGNOSIS — M25512 Pain in left shoulder: Secondary | ICD-10-CM

## 2019-10-25 ENCOUNTER — Ambulatory Visit: Payer: Medicare Other | Admitting: Orthopaedic Surgery

## 2019-10-25 ENCOUNTER — Encounter: Payer: Self-pay | Admitting: Orthopaedic Surgery

## 2019-10-25 ENCOUNTER — Other Ambulatory Visit: Payer: Self-pay

## 2019-10-25 VITALS — Ht 68.0 in | Wt 178.0 lb

## 2019-10-25 DIAGNOSIS — M7542 Impingement syndrome of left shoulder: Secondary | ICD-10-CM | POA: Diagnosis not present

## 2019-10-25 DIAGNOSIS — S46812A Strain of other muscles, fascia and tendons at shoulder and upper arm level, left arm, initial encounter: Secondary | ICD-10-CM | POA: Diagnosis not present
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
## 2019-11-13 ENCOUNTER — Other Ambulatory Visit: Payer: Self-pay | Admitting: Physician Assistant

## 2019-11-13 MED ORDER — OXYCODONE-ACETAMINOPHEN 5-325 MG PO TABS: 1.0000 | ORAL_TABLET | Freq: Three times a day (TID) | ORAL | 0 refills | Status: DC | PRN

## 2019-11-13 MED ORDER — ONDANSETRON HCL 4 MG PO TABS: 4.0000 mg | ORAL_TABLET | Freq: Three times a day (TID) | ORAL | 0 refills | Status: DC | PRN

## 2019-11-14 ENCOUNTER — Encounter: Payer: Self-pay | Admitting: Orthopaedic Surgery

## 2019-11-14 DIAGNOSIS — M75102 Unspecified rotator cuff tear or rupture of left shoulder, not specified as traumatic: Secondary | ICD-10-CM | POA: Insufficient documentation

## 2019-11-14 DIAGNOSIS — M24112 Other articular cartilage disorders, left shoulder: Secondary | ICD-10-CM

## 2019-11-14 DIAGNOSIS — M67922 Unspecified disorder of synovium and tendon, left upper arm: Secondary | ICD-10-CM

## 2019-11-14 DIAGNOSIS — M7542 Impingement syndrome of left shoulder: Secondary | ICD-10-CM

## 2019-11-20 ENCOUNTER — Inpatient Hospital Stay: Payer: Medicare Other | Admitting: Orthopaedic Surgery

## 2019-11-22 ENCOUNTER — Ambulatory Visit (INDEPENDENT_AMBULATORY_CARE_PROVIDER_SITE_OTHER): Payer: Medicare Other | Admitting: Physician Assistant

## 2019-11-22 ENCOUNTER — Other Ambulatory Visit: Payer: Self-pay | Admitting: Physician Assistant

## 2019-11-22 ENCOUNTER — Other Ambulatory Visit: Payer: Self-pay

## 2019-11-22 ENCOUNTER — Encounter: Payer: Self-pay | Admitting: Orthopaedic Surgery

## 2019-11-22 VITALS — Ht 68.0 in | Wt 178.0 lb

## 2019-11-22 DIAGNOSIS — Z9889 Other specified postprocedural states: Secondary | ICD-10-CM
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
## 2019-11-29 ENCOUNTER — Telehealth: Payer: Self-pay

## 2019-11-29 NOTE — Telephone Encounter (Signed)
Patient left VM on Triage line. States he is in a lot of pain . S/P Left Shoulder Arthroscopy

## 2019-11-30 NOTE — Telephone Encounter (Signed)
Does he need refill on pain meds?

## 2019-12-02 NOTE — Telephone Encounter (Signed)
Patient states he would like to come in tomorrow. Having some issues with arm.

## 2019-12-03 ENCOUNTER — Ambulatory Visit (INDEPENDENT_AMBULATORY_CARE_PROVIDER_SITE_OTHER): Payer: Medicare Other | Admitting: Orthopaedic Surgery

## 2019-12-03 ENCOUNTER — Encounter: Payer: Self-pay | Admitting: Orthopaedic Surgery

## 2019-12-03 ENCOUNTER — Other Ambulatory Visit: Payer: Self-pay

## 2019-12-03 DIAGNOSIS — M75102 Unspecified rotator cuff tear or rupture of left shoulder, not specified as traumatic: Secondary | ICD-10-CM

## 2019-12-03 DIAGNOSIS — M24112 Other articular cartilage disorders, left shoulder: Secondary | ICD-10-CM

## 2019-12-03 DIAGNOSIS — M67922 Unspecified disorder of synovium and tendon, left upper arm: Secondary | ICD-10-CM
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
## 2019-12-12 ENCOUNTER — Other Ambulatory Visit: Payer: Self-pay

## 2019-12-12 ENCOUNTER — Ambulatory Visit (INDEPENDENT_AMBULATORY_CARE_PROVIDER_SITE_OTHER): Payer: Medicare Other | Admitting: Rehabilitative and Restorative Service Providers"

## 2019-12-12 ENCOUNTER — Encounter: Payer: Self-pay | Admitting: Rehabilitative and Restorative Service Providers"

## 2019-12-12 DIAGNOSIS — M25512 Pain in left shoulder: Secondary | ICD-10-CM | POA: Diagnosis not present

## 2019-12-12 DIAGNOSIS — M25612 Stiffness of left shoulder, not elsewhere classified: Secondary | ICD-10-CM

## 2019-12-12 DIAGNOSIS — M7542 Impingement syndrome of left shoulder: Secondary | ICD-10-CM | POA: Diagnosis not present

## 2019-12-12 DIAGNOSIS — G8929 Other chronic pain: Secondary | ICD-10-CM | POA: Diagnosis not present
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
## 2019-12-12 NOTE — Patient Instructions (Signed)
HEP of shoulder blade pinches and supine arm raises for scapular strengthening and supine shoulder IR/ER stretch for AROM.

## 2019-12-19 ENCOUNTER — Other Ambulatory Visit: Payer: Self-pay

## 2019-12-19 ENCOUNTER — Ambulatory Visit (INDEPENDENT_AMBULATORY_CARE_PROVIDER_SITE_OTHER): Payer: Medicare Other | Admitting: Physical Therapy

## 2019-12-19 DIAGNOSIS — G8929 Other chronic pain: Secondary | ICD-10-CM | POA: Diagnosis not present

## 2019-12-19 DIAGNOSIS — M25612 Stiffness of left shoulder, not elsewhere classified: Secondary | ICD-10-CM

## 2019-12-19 DIAGNOSIS — M7542 Impingement syndrome of left shoulder: Secondary | ICD-10-CM | POA: Diagnosis not present

## 2019-12-19 DIAGNOSIS — M25512 Pain in left shoulder: Secondary | ICD-10-CM
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
## 2019-12-23 ENCOUNTER — Ambulatory Visit (INDEPENDENT_AMBULATORY_CARE_PROVIDER_SITE_OTHER): Payer: Medicare Other | Admitting: Rehabilitative and Restorative Service Providers"

## 2019-12-23 ENCOUNTER — Encounter: Payer: Self-pay | Admitting: Rehabilitative and Restorative Service Providers"

## 2019-12-23 ENCOUNTER — Other Ambulatory Visit: Payer: Self-pay

## 2019-12-23 DIAGNOSIS — M25512 Pain in left shoulder: Secondary | ICD-10-CM

## 2019-12-23 DIAGNOSIS — G8929 Other chronic pain: Secondary | ICD-10-CM

## 2019-12-23 DIAGNOSIS — M25612 Stiffness of left shoulder, not elsewhere classified: Secondary | ICD-10-CM | POA: Diagnosis not present

## 2019-12-23 DIAGNOSIS — M7542 Impingement syndrome of left shoulder: Secondary | ICD-10-CM | POA: Diagnosis not present
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
## 2019-12-25 ENCOUNTER — Encounter: Payer: Self-pay | Admitting: Family Medicine

## 2019-12-25 ENCOUNTER — Ambulatory Visit: Payer: Medicare Other | Admitting: Family Medicine

## 2019-12-25 VITALS — BP 89/50 | HR 73 | Ht 68.0 in | Wt 170.0 lb

## 2019-12-25 DIAGNOSIS — Z9989 Dependence on other enabling machines and devices: Secondary | ICD-10-CM | POA: Diagnosis not present

## 2019-12-25 DIAGNOSIS — G8250 Quadriplegia, unspecified: Secondary | ICD-10-CM | POA: Diagnosis not present

## 2019-12-25 DIAGNOSIS — G4733 Obstructive sleep apnea (adult) (pediatric): Secondary | ICD-10-CM | POA: Diagnosis not present
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
## 2019-12-25 NOTE — Patient Instructions (Signed)
Please continue using your CPAP regularly. While your insurance requires that you use CPAP at least 4 hours each night on 70% of the nights, I recommend, that you not skip any nights and use it throughout the night if you can. Getting used to CPAP and staying with the treatment long term does take time and patience and discipline. Untreated obstructive sleep apnea when it is moderate to severe can have an adverse impact on cardiovascular health and raise her risk for heart disease, arrhythmias, hypertension, congestive heart failure, stroke and diabetes. Untreated obstructive sleep apnea causes sleep disruption, nonrestorative sleep, and sleep deprivation. This can have an impact on your day to day functioning and cause daytime sleepiness and impairment of cognitive function, memory loss, mood disturbance, and problems focussing. Using CPAP regularly can improve these symptoms.   We will repeat download in 6 weeks. If leak is better and apneic events improve, we will follow up in 1 year. If leak continues or we need to make pressure setting changes, we will follow up in 3-6 months.   Stay well hydrated. Stay active. Well balanced diet.   Sleep Apnea Sleep apnea affects breathing during sleep. It causes breathing to stop for a short time or to become shallow. It can also increase the risk of:  Heart attack.  Stroke.  Being very overweight (obese).  Diabetes.  Heart failure.  Irregular heartbeat. The goal of treatment is to help you breathe normally again. What are the causes? There are three kinds of sleep apnea:  Obstructive sleep apnea. This is caused by a blocked or collapsed airway.  Central sleep apnea. This happens when the brain does not send the right signals to the muscles that control breathing.  Mixed sleep apnea. This is a combination of obstructive and central sleep apnea. The most common cause of this condition is a collapsed or blocked airway. This can happen if:  Your  throat muscles are too relaxed.  Your tongue and tonsils are too large.  You are overweight.  Your airway is too small. What increases the risk?  Being overweight.  Smoking.  Having a small airway.  Being older.  Being male.  Drinking alcohol.  Taking medicines to calm yourself (sedatives or tranquilizers).  Having family members with the condition. What are the signs or symptoms?  Trouble staying asleep.  Being sleepy or tired during the day.  Getting angry a lot.  Loud snoring.  Headaches in the morning.  Not being able to focus your mind (concentrate).  Forgetting things.  Less interest in sex.  Mood swings.  Personality changes.  Feelings of sadness (depression).  Waking up a lot during the night to pee (urinate).  Dry mouth.  Sore throat. How is this diagnosed?  Your medical history.  A physical exam.  A test that is done when you are sleeping (sleep study). The test is most often done in a sleep lab but may also be done at home. How is this treated?   Sleeping on your side.  Using a medicine to get rid of mucus in your nose (decongestant).  Avoiding the use of alcohol, medicines to help you relax, or certain pain medicines (narcotics).  Losing weight, if needed.  Changing your diet.  Not smoking.  Using a machine to open your airway while you sleep, such as: ? An oral appliance. This is a mouthpiece that shifts your lower jaw forward. ? A CPAP device. This device blows air through a mask when you breathe  out (exhale). ? An EPAP device. This has valves that you put in each nostril. ? A BPAP device. This device blows air through a mask when you breathe in (inhale) and breathe out.  Having surgery if other treatments do not work. It is important to get treatment for sleep apnea. Without treatment, it can lead to:  High blood pressure.  Coronary artery disease.  In men, not being able to have an erection (impotence).  Reduced  thinking ability. Follow these instructions at home: Lifestyle  Make changes that your doctor recommends.  Eat a healthy diet.  Lose weight if needed.  Avoid alcohol, medicines to help you relax, and some pain medicines.  Do not use any products that contain nicotine or tobacco, such as cigarettes, e-cigarettes, and chewing tobacco. If you need help quitting, ask your doctor. General instructions  Take over-the-counter and prescription medicines only as told by your doctor.  If you were given a machine to use while you sleep, use it only as told by your doctor.  If you are having surgery, make sure to tell your doctor you have sleep apnea. You may need to bring your device with you.  Keep all follow-up visits as told by your doctor. This is important. Contact a doctor if:  The machine that you were given to use during sleep bothers you or does not seem to be working.  You do not get better.  You get worse. Get help right away if:  Your chest hurts.  You have trouble breathing in enough air.  You have an uncomfortable feeling in your back, arms, or stomach.  You have trouble talking.  One side of your body feels weak.  A part of your face is hanging down. These symptoms may be an emergency. Do not wait to see if the symptoms will go away. Get medical help right away. Call your local emergency services (911 in the [place] Do not drive yourself to the hospital. Summary  This condition affects breathing during sleep.  The most common cause is a collapsed or blocked airway.  The goal of treatment is to help you breathe normally while you sleep. This information is not intended to replace advice given to you by your health care provider. Make sure you discuss any questions you have with your health care provider. Document Revised: 04/20/2018 Document Reviewed: 02/27/2018 Elsevier Patient Education  [place].

## 2019-12-27 ENCOUNTER — Ambulatory Visit (INDEPENDENT_AMBULATORY_CARE_PROVIDER_SITE_OTHER): Payer: Medicare Other | Admitting: Rehabilitative and Restorative Service Providers"

## 2019-12-27 ENCOUNTER — Encounter: Payer: Self-pay | Admitting: Rehabilitative and Restorative Service Providers"

## 2019-12-27 ENCOUNTER — Other Ambulatory Visit: Payer: Self-pay

## 2019-12-27 DIAGNOSIS — M25612 Stiffness of left shoulder, not elsewhere classified: Secondary | ICD-10-CM | POA: Diagnosis not present

## 2019-12-27 DIAGNOSIS — G8929 Other chronic pain: Secondary | ICD-10-CM

## 2019-12-27 DIAGNOSIS — M7542 Impingement syndrome of left shoulder: Secondary | ICD-10-CM | POA: Diagnosis not present

## 2019-12-27 DIAGNOSIS — M25512 Pain in left shoulder: Secondary | ICD-10-CM | POA: Diagnosis not present
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
## 2019-12-31 ENCOUNTER — Other Ambulatory Visit: Payer: Self-pay

## 2019-12-31 ENCOUNTER — Encounter: Payer: Self-pay | Admitting: Rehabilitative and Restorative Service Providers"

## 2019-12-31 ENCOUNTER — Ambulatory Visit (INDEPENDENT_AMBULATORY_CARE_PROVIDER_SITE_OTHER): Payer: Medicare Other | Admitting: Rehabilitative and Restorative Service Providers"

## 2019-12-31 DIAGNOSIS — M25612 Stiffness of left shoulder, not elsewhere classified: Secondary | ICD-10-CM

## 2019-12-31 DIAGNOSIS — G8929 Other chronic pain: Secondary | ICD-10-CM

## 2019-12-31 DIAGNOSIS — M7542 Impingement syndrome of left shoulder: Secondary | ICD-10-CM | POA: Diagnosis not present

## 2019-12-31 DIAGNOSIS — M25512 Pain in left shoulder: Secondary | ICD-10-CM
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
## 2020-01-03 ENCOUNTER — Encounter: Payer: Medicare Other | Admitting: Rehabilitative and Restorative Service Providers"

## 2020-01-03 ENCOUNTER — Other Ambulatory Visit: Payer: Self-pay

## 2020-01-03 ENCOUNTER — Encounter: Payer: Self-pay | Admitting: Rehabilitative and Restorative Service Providers"

## 2020-01-03 ENCOUNTER — Ambulatory Visit (INDEPENDENT_AMBULATORY_CARE_PROVIDER_SITE_OTHER): Payer: Medicare Other | Admitting: Rehabilitative and Restorative Service Providers"

## 2020-01-03 DIAGNOSIS — G8929 Other chronic pain: Secondary | ICD-10-CM | POA: Diagnosis not present

## 2020-01-03 DIAGNOSIS — M25612 Stiffness of left shoulder, not elsewhere classified: Secondary | ICD-10-CM | POA: Diagnosis not present

## 2020-01-03 DIAGNOSIS — M7542 Impingement syndrome of left shoulder: Secondary | ICD-10-CM

## 2020-01-03 DIAGNOSIS — M25512 Pain in left shoulder: Secondary | ICD-10-CM | POA: Diagnosis not present
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
## 2020-01-07 ENCOUNTER — Ambulatory Visit (INDEPENDENT_AMBULATORY_CARE_PROVIDER_SITE_OTHER): Payer: Medicare Other | Admitting: Rehabilitative and Restorative Service Providers"

## 2020-01-07 ENCOUNTER — Encounter: Payer: Self-pay | Admitting: Rehabilitative and Restorative Service Providers"

## 2020-01-07 ENCOUNTER — Other Ambulatory Visit: Payer: Self-pay

## 2020-01-07 DIAGNOSIS — M7542 Impingement syndrome of left shoulder: Secondary | ICD-10-CM

## 2020-01-07 DIAGNOSIS — M25612 Stiffness of left shoulder, not elsewhere classified: Secondary | ICD-10-CM

## 2020-01-07 DIAGNOSIS — M25512 Pain in left shoulder: Secondary | ICD-10-CM | POA: Diagnosis not present

## 2020-01-07 DIAGNOSIS — G8929 Other chronic pain: Secondary | ICD-10-CM | POA: Diagnosis not present
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
## 2020-01-09 ENCOUNTER — Ambulatory Visit (INDEPENDENT_AMBULATORY_CARE_PROVIDER_SITE_OTHER): Payer: Medicare Other | Admitting: Rehabilitative and Restorative Service Providers"

## 2020-01-09 ENCOUNTER — Other Ambulatory Visit: Payer: Self-pay

## 2020-01-09 ENCOUNTER — Encounter: Payer: Self-pay | Admitting: Rehabilitative and Restorative Service Providers"

## 2020-01-09 DIAGNOSIS — G8929 Other chronic pain: Secondary | ICD-10-CM | POA: Diagnosis not present

## 2020-01-09 DIAGNOSIS — M25612 Stiffness of left shoulder, not elsewhere classified: Secondary | ICD-10-CM | POA: Diagnosis not present

## 2020-01-09 DIAGNOSIS — M25512 Pain in left shoulder: Secondary | ICD-10-CM | POA: Diagnosis not present

## 2020-01-09 DIAGNOSIS — M7542 Impingement syndrome of left shoulder: Secondary | ICD-10-CM
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
## 2020-01-14 ENCOUNTER — Ambulatory Visit (INDEPENDENT_AMBULATORY_CARE_PROVIDER_SITE_OTHER): Payer: Medicare Other | Admitting: Rehabilitative and Restorative Service Providers"

## 2020-01-14 ENCOUNTER — Other Ambulatory Visit: Payer: Self-pay

## 2020-01-14 ENCOUNTER — Encounter: Payer: Self-pay | Admitting: Rehabilitative and Restorative Service Providers"

## 2020-01-14 DIAGNOSIS — M25612 Stiffness of left shoulder, not elsewhere classified: Secondary | ICD-10-CM | POA: Diagnosis not present

## 2020-01-14 DIAGNOSIS — G8929 Other chronic pain: Secondary | ICD-10-CM

## 2020-01-14 DIAGNOSIS — M25512 Pain in left shoulder: Secondary | ICD-10-CM | POA: Diagnosis not present

## 2020-01-14 DIAGNOSIS — M7542 Impingement syndrome of left shoulder: Secondary | ICD-10-CM
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
## 2020-01-15 ENCOUNTER — Encounter: Payer: Self-pay | Admitting: Orthopaedic Surgery

## 2020-01-15 ENCOUNTER — Ambulatory Visit: Payer: Medicare Other | Admitting: Orthopaedic Surgery

## 2020-01-15 DIAGNOSIS — Z9889 Other specified postprocedural states: Secondary | ICD-10-CM | POA: Insufficient documentation

## 2020-01-15 DIAGNOSIS — M24112 Other articular cartilage disorders, left shoulder: Secondary | ICD-10-CM

## 2020-01-15 DIAGNOSIS — M75102 Unspecified rotator cuff tear or rupture of left shoulder, not specified as traumatic: Secondary | ICD-10-CM

## 2020-01-15 DIAGNOSIS — M67922 Unspecified disorder of synovium and tendon, left upper arm: Secondary | ICD-10-CM
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
## 2020-01-16 ENCOUNTER — Encounter: Payer: Medicare Other | Admitting: Rehabilitative and Restorative Service Providers"

## 2020-01-16 ENCOUNTER — Telehealth: Payer: Self-pay | Admitting: Rehabilitative and Restorative Service Providers"

## 2020-01-16 NOTE — Telephone Encounter (Signed)
Called Pt. After 15 mins no show for appointment.  Pt. Indicated he got behind.  After quick discussion, Agreement to reschedule appointment was made.  Scot Jun, PT, DPT, OCS, ATC 01/16/20  12:02 PM

## 2020-01-21 ENCOUNTER — Other Ambulatory Visit: Payer: Self-pay

## 2020-01-21 ENCOUNTER — Encounter: Payer: Self-pay | Admitting: Physical Therapy

## 2020-01-21 ENCOUNTER — Ambulatory Visit (INDEPENDENT_AMBULATORY_CARE_PROVIDER_SITE_OTHER): Payer: Medicare Other | Admitting: Physical Therapy

## 2020-01-21 DIAGNOSIS — M7542 Impingement syndrome of left shoulder: Secondary | ICD-10-CM

## 2020-01-21 DIAGNOSIS — M25512 Pain in left shoulder: Secondary | ICD-10-CM | POA: Diagnosis not present

## 2020-01-21 DIAGNOSIS — M25612 Stiffness of left shoulder, not elsewhere classified: Secondary | ICD-10-CM

## 2020-01-21 DIAGNOSIS — G8929 Other chronic pain: Secondary | ICD-10-CM | POA: Diagnosis not present
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
## 2020-01-24 ENCOUNTER — Ambulatory Visit: Payer: Medicare Other | Admitting: Physical Therapy

## 2020-01-24 ENCOUNTER — Encounter: Payer: Self-pay | Admitting: Physical Therapy

## 2020-01-24 ENCOUNTER — Other Ambulatory Visit: Payer: Self-pay

## 2020-01-24 DIAGNOSIS — G8929 Other chronic pain: Secondary | ICD-10-CM

## 2020-01-24 DIAGNOSIS — M25612 Stiffness of left shoulder, not elsewhere classified: Secondary | ICD-10-CM

## 2020-01-24 DIAGNOSIS — M7542 Impingement syndrome of left shoulder: Secondary | ICD-10-CM | POA: Diagnosis not present

## 2020-01-24 DIAGNOSIS — M25512 Pain in left shoulder: Secondary | ICD-10-CM | POA: Diagnosis not present
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
## 2020-01-28 ENCOUNTER — Ambulatory Visit: Payer: Medicare Other | Admitting: Rehabilitative and Restorative Service Providers"

## 2020-01-28 ENCOUNTER — Other Ambulatory Visit: Payer: Self-pay

## 2020-01-28 ENCOUNTER — Encounter: Payer: Self-pay | Admitting: Rehabilitative and Restorative Service Providers"

## 2020-01-28 DIAGNOSIS — M25512 Pain in left shoulder: Secondary | ICD-10-CM

## 2020-01-28 DIAGNOSIS — G8929 Other chronic pain: Secondary | ICD-10-CM

## 2020-01-28 DIAGNOSIS — M25612 Stiffness of left shoulder, not elsewhere classified: Secondary | ICD-10-CM

## 2020-01-28 DIAGNOSIS — M7542 Impingement syndrome of left shoulder: Secondary | ICD-10-CM
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
## 2020-01-30 ENCOUNTER — Ambulatory Visit (INDEPENDENT_AMBULATORY_CARE_PROVIDER_SITE_OTHER): Payer: Medicare Other | Admitting: Rehabilitative and Restorative Service Providers"

## 2020-01-30 ENCOUNTER — Encounter: Payer: Self-pay | Admitting: Rehabilitative and Restorative Service Providers"

## 2020-01-30 ENCOUNTER — Other Ambulatory Visit: Payer: Self-pay

## 2020-01-30 DIAGNOSIS — M25512 Pain in left shoulder: Secondary | ICD-10-CM

## 2020-01-30 DIAGNOSIS — M7542 Impingement syndrome of left shoulder: Secondary | ICD-10-CM | POA: Diagnosis not present

## 2020-01-30 DIAGNOSIS — M25612 Stiffness of left shoulder, not elsewhere classified: Secondary | ICD-10-CM

## 2020-01-30 DIAGNOSIS — G8929 Other chronic pain: Secondary | ICD-10-CM
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
## 2020-02-06 ENCOUNTER — Telehealth: Payer: Self-pay | Admitting: Family Medicine

## 2020-02-06 DIAGNOSIS — G4733 Obstructive sleep apnea (adult) (pediatric): Secondary | ICD-10-CM

## 2020-02-06 NOTE — Telephone Encounter (Signed)
Please let him know that I have reviewed most recent compliance report. There is still a large leak noted. Has he been able to work with his mask at home? Does he continue to note a leak? We can send him for a mask refitting if he would like.

## 2020-02-07 ENCOUNTER — Encounter: Payer: Medicare Other | Admitting: Rehabilitative and Restorative Service Providers"

## 2020-02-10 NOTE — Addendum Note (Signed)
Addended by: Brandon Melnick on: 02/10/2020 10:51 AM   Modules accepted: Orders

## 2020-02-10 NOTE — Addendum Note (Signed)
Addended byUbaldo Glassing, Jushua Waltman L on: 02/10/2020 11:22 AM   Modules accepted: Orders

## 2020-02-10 NOTE — Telephone Encounter (Signed)
I called pt and I relayed that the cpap download did show leak.  He does notice this as well when he moves.  Would like to get mask refitting.  I would let AL/NP know to place order. Huey Romans [phone] or [phone].

## 2020-02-10 NOTE — Telephone Encounter (Signed)
Order for cpap supplies sent to [place] via Fax. Confirmation received that the order transmitted was successful.

## 2020-02-14 ENCOUNTER — Other Ambulatory Visit: Payer: Self-pay | Admitting: Physician Assistant

## 2020-02-14 ENCOUNTER — Other Ambulatory Visit: Payer: Self-pay

## 2020-02-14 ENCOUNTER — Ambulatory Visit
Admission: RE | Admit: 2020-02-14 | Discharge: 2020-02-14 | Disposition: A | Discharge status: Home / Self Care | Payer: Medicare Other | Source: Ambulatory Visit | Attending: Physician Assistant | Admitting: Physician Assistant

## 2020-02-14 DIAGNOSIS — T1490XA Injury, unspecified, initial encounter: Secondary | ICD-10-CM

## 2020-03-24 ENCOUNTER — Other Ambulatory Visit: Payer: Self-pay | Admitting: Neurology

## 2020-03-24 DIAGNOSIS — G8250 Quadriplegia, unspecified: Secondary | ICD-10-CM

## 2020-03-24 DIAGNOSIS — S14105S Unspecified injury at C5 level of cervical spinal cord, sequela: Secondary | ICD-10-CM

## 2020-05-18 ENCOUNTER — Other Ambulatory Visit: Payer: Self-pay | Admitting: Neurology

## 2020-05-18 DIAGNOSIS — G8250 Quadriplegia, unspecified: Secondary | ICD-10-CM

## 2020-05-18 DIAGNOSIS — S14105S Unspecified injury at C5 level of cervical spinal cord, sequela: Secondary | ICD-10-CM

## 2020-06-23 ENCOUNTER — Ambulatory Visit
Admission: RE | Admit: 2020-06-23 | Discharge: 2020-06-23 | Disposition: A | Discharge status: Home / Self Care | Payer: Medicare Other | Source: Ambulatory Visit | Attending: Physician Assistant | Admitting: Physician Assistant

## 2020-06-23 ENCOUNTER — Other Ambulatory Visit: Payer: Self-pay | Admitting: Physician Assistant

## 2020-06-23 DIAGNOSIS — R52 Pain, unspecified: Secondary | ICD-10-CM

## 2020-07-01 ENCOUNTER — Telehealth: Payer: Self-pay | Admitting: Radiology

## 2020-07-01 DIAGNOSIS — G8929 Other chronic pain: Secondary | ICD-10-CM

## 2020-07-01 NOTE — Telephone Encounter (Signed)
PT order entered

## 2020-07-01 NOTE — Telephone Encounter (Signed)
yes

## 2020-07-01 NOTE — Telephone Encounter (Signed)
Please see message below from PT.  OK for new referral?  Patient called for PT appointment. Need new referral in epic. Thanks

## 2020-07-01 NOTE — Addendum Note (Signed)
Addended by: Meyer Cory on: 07/01/2020 02:55 PM   Modules accepted: Orders

## 2020-07-03 ENCOUNTER — Encounter: Payer: Self-pay | Admitting: Orthopaedic Surgery

## 2020-07-03 ENCOUNTER — Other Ambulatory Visit: Payer: Self-pay

## 2020-07-03 ENCOUNTER — Ambulatory Visit (INDEPENDENT_AMBULATORY_CARE_PROVIDER_SITE_OTHER): Payer: Medicare Other | Admitting: Orthopaedic Surgery

## 2020-07-03 ENCOUNTER — Ambulatory Visit: Payer: Self-pay

## 2020-07-03 VITALS — Ht 68.0 in | Wt 175.0 lb

## 2020-07-03 DIAGNOSIS — M1712 Unilateral primary osteoarthritis, left knee: Secondary | ICD-10-CM | POA: Diagnosis not present

## 2020-07-03 MED ORDER — BUPIVACAINE HCL 0.25 % IJ SOLN
2.0000 mL | INTRAMUSCULAR | Status: AC | PRN
  Administered 2020-07-03: 11:00:00 2 mL via INTRA_ARTICULAR

## 2020-07-03 MED ORDER — LIDOCAINE HCL 1 % IJ SOLN
2.0000 mL | INTRAMUSCULAR | Status: AC | PRN
  Administered 2020-07-03: 11:00:00 2 mL

## 2020-07-03 MED ORDER — METHYLPREDNISOLONE ACETATE 40 MG/ML IJ SUSP
40.0000 mg | INTRAMUSCULAR | Status: AC | PRN
  Administered 2020-07-03: 11:00:00 40 mg via INTRA_ARTICULAR
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
## 2020-07-08 ENCOUNTER — Ambulatory Visit (INDEPENDENT_AMBULATORY_CARE_PROVIDER_SITE_OTHER): Payer: Medicare Other | Admitting: Physical Therapy

## 2020-07-08 ENCOUNTER — Other Ambulatory Visit: Payer: Self-pay

## 2020-07-08 ENCOUNTER — Encounter: Payer: Self-pay | Admitting: Physical Therapy

## 2020-07-08 DIAGNOSIS — M25562 Pain in left knee: Secondary | ICD-10-CM | POA: Diagnosis not present

## 2020-07-08 DIAGNOSIS — M25662 Stiffness of left knee, not elsewhere classified: Secondary | ICD-10-CM | POA: Diagnosis not present

## 2020-07-08 DIAGNOSIS — M25551 Pain in right hip: Secondary | ICD-10-CM

## 2020-07-08 DIAGNOSIS — M6281 Muscle weakness (generalized): Secondary | ICD-10-CM

## 2020-07-08 DIAGNOSIS — R2689 Other abnormalities of gait and mobility: Secondary | ICD-10-CM

## 2020-07-08 DIAGNOSIS — R600 Localized edema: Secondary | ICD-10-CM

## 2020-07-08 DIAGNOSIS — G8929 Other chronic pain: Secondary | ICD-10-CM

## 2020-07-08 NOTE — Patient Instructions (Addendum)
Access Code: [email or web address] URL: [email or web address] Date: 07/08/2020 Prepared by: Elsie Ra  Exercises Hip Flexor Stretch at [facility] of Bed - 2 x daily - 6 x weekly - 3 sets holding 20 sec Seated Knee Flexion Stretch - 2 x daily - 6 x weekly - 1-2 sets - 10 reps - 5 sec hold Seated March - 2 x daily - 6 x weekly - 2-3 sets - 10 reps Seated Straight Leg Heel Taps - 2 x daily - 6 x weekly - 3 sets - 10 reps Mini Squat with Counter Support - 2 x daily - 6 x weekly - 1-2 sets - 10 reps
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
## 2020-07-16 ENCOUNTER — Telehealth: Payer: Self-pay

## 2020-07-16 ENCOUNTER — Ambulatory Visit (INDEPENDENT_AMBULATORY_CARE_PROVIDER_SITE_OTHER): Payer: Medicare Other | Admitting: Orthopaedic Surgery

## 2020-07-16 ENCOUNTER — Encounter: Payer: Self-pay | Admitting: Orthopaedic Surgery

## 2020-07-16 ENCOUNTER — Other Ambulatory Visit: Payer: Self-pay

## 2020-07-16 VITALS — Ht 68.0 in | Wt 175.0 lb

## 2020-07-16 DIAGNOSIS — M1712 Unilateral primary osteoarthritis, left knee: Secondary | ICD-10-CM | POA: Diagnosis not present
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
## 2020-07-16 NOTE — Telephone Encounter (Signed)
Please get auth for left knee gel injection  

## 2020-07-17 ENCOUNTER — Other Ambulatory Visit: Payer: Self-pay | Admitting: Neurology

## 2020-07-17 DIAGNOSIS — G8250 Quadriplegia, unspecified: Secondary | ICD-10-CM

## 2020-07-17 DIAGNOSIS — S14105S Unspecified injury at C5 level of cervical spinal cord, sequela: Secondary | ICD-10-CM

## 2020-07-21 NOTE — Telephone Encounter (Signed)
Noted  

## 2020-07-22 ENCOUNTER — Other Ambulatory Visit: Payer: Self-pay

## 2020-07-22 ENCOUNTER — Encounter: Payer: Self-pay | Admitting: Rehabilitative and Restorative Service Providers"

## 2020-07-22 ENCOUNTER — Other Ambulatory Visit: Payer: Self-pay | Admitting: Neurology

## 2020-07-22 ENCOUNTER — Ambulatory Visit: Payer: Medicare Other | Admitting: Rehabilitative and Restorative Service Providers"

## 2020-07-22 DIAGNOSIS — M6281 Muscle weakness (generalized): Secondary | ICD-10-CM | POA: Diagnosis not present

## 2020-07-22 DIAGNOSIS — G8250 Quadriplegia, unspecified: Secondary | ICD-10-CM

## 2020-07-22 DIAGNOSIS — M25562 Pain in left knee: Secondary | ICD-10-CM | POA: Diagnosis not present

## 2020-07-22 DIAGNOSIS — S14105S Unspecified injury at C5 level of cervical spinal cord, sequela: Secondary | ICD-10-CM

## 2020-07-22 DIAGNOSIS — R262 Difficulty in walking, not elsewhere classified: Secondary | ICD-10-CM

## 2020-07-22 DIAGNOSIS — M25662 Stiffness of left knee, not elsewhere classified: Secondary | ICD-10-CM | POA: Diagnosis not present

## 2020-07-22 DIAGNOSIS — G8929 Other chronic pain: Secondary | ICD-10-CM

## 2020-07-22 DIAGNOSIS — R600 Localized edema: Secondary | ICD-10-CM
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
## 2020-07-24 DIAGNOSIS — N3021 Other chronic cystitis with hematuria: Secondary | ICD-10-CM | POA: Diagnosis not present

## 2020-07-28 ENCOUNTER — Ambulatory Visit (INDEPENDENT_AMBULATORY_CARE_PROVIDER_SITE_OTHER): Payer: Medicare Other | Admitting: Physical Therapy

## 2020-07-28 ENCOUNTER — Encounter: Payer: Self-pay | Admitting: Physical Therapy

## 2020-07-28 ENCOUNTER — Other Ambulatory Visit: Payer: Self-pay

## 2020-07-28 DIAGNOSIS — M25662 Stiffness of left knee, not elsewhere classified: Secondary | ICD-10-CM

## 2020-07-28 DIAGNOSIS — R262 Difficulty in walking, not elsewhere classified: Secondary | ICD-10-CM

## 2020-07-28 DIAGNOSIS — M25562 Pain in left knee: Secondary | ICD-10-CM | POA: Diagnosis not present

## 2020-07-28 DIAGNOSIS — G8929 Other chronic pain: Secondary | ICD-10-CM | POA: Diagnosis not present

## 2020-07-28 DIAGNOSIS — M6281 Muscle weakness (generalized): Secondary | ICD-10-CM | POA: Diagnosis not present

## 2020-07-28 DIAGNOSIS — R600 Localized edema: Secondary | ICD-10-CM
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
## 2020-08-04 ENCOUNTER — Encounter: Payer: Self-pay | Admitting: Physical Therapy

## 2020-08-04 ENCOUNTER — Ambulatory Visit: Payer: Medicare Other | Admitting: Physical Therapy

## 2020-08-04 ENCOUNTER — Other Ambulatory Visit: Payer: Self-pay

## 2020-08-04 DIAGNOSIS — R262 Difficulty in walking, not elsewhere classified: Secondary | ICD-10-CM | POA: Diagnosis not present

## 2020-08-04 DIAGNOSIS — M25562 Pain in left knee: Secondary | ICD-10-CM | POA: Diagnosis not present

## 2020-08-04 DIAGNOSIS — M25662 Stiffness of left knee, not elsewhere classified: Secondary | ICD-10-CM | POA: Diagnosis not present

## 2020-08-04 DIAGNOSIS — M6281 Muscle weakness (generalized): Secondary | ICD-10-CM | POA: Diagnosis not present

## 2020-08-04 DIAGNOSIS — G8929 Other chronic pain: Secondary | ICD-10-CM | POA: Diagnosis not present
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
## 2020-08-11 ENCOUNTER — Other Ambulatory Visit: Payer: Self-pay

## 2020-08-11 ENCOUNTER — Ambulatory Visit: Payer: Medicare Other | Admitting: Physical Therapy

## 2020-08-11 DIAGNOSIS — M25562 Pain in left knee: Secondary | ICD-10-CM

## 2020-08-11 DIAGNOSIS — R262 Difficulty in walking, not elsewhere classified: Secondary | ICD-10-CM | POA: Diagnosis not present

## 2020-08-11 DIAGNOSIS — M25551 Pain in right hip: Secondary | ICD-10-CM

## 2020-08-11 DIAGNOSIS — M25662 Stiffness of left knee, not elsewhere classified: Secondary | ICD-10-CM | POA: Diagnosis not present

## 2020-08-11 DIAGNOSIS — G8929 Other chronic pain: Secondary | ICD-10-CM

## 2020-08-11 DIAGNOSIS — R600 Localized edema: Secondary | ICD-10-CM

## 2020-08-11 DIAGNOSIS — M6281 Muscle weakness (generalized): Secondary | ICD-10-CM | POA: Diagnosis not present
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
## 2020-08-19 ENCOUNTER — Other Ambulatory Visit: Payer: Self-pay

## 2020-08-19 ENCOUNTER — Telehealth: Payer: Self-pay

## 2020-08-19 ENCOUNTER — Ambulatory Visit: Payer: Medicare Other | Admitting: Rehabilitative and Restorative Service Providers"

## 2020-08-19 ENCOUNTER — Encounter: Payer: Self-pay | Admitting: Rehabilitative and Restorative Service Providers"

## 2020-08-19 DIAGNOSIS — M6281 Muscle weakness (generalized): Secondary | ICD-10-CM

## 2020-08-19 DIAGNOSIS — R296 Repeated falls: Secondary | ICD-10-CM

## 2020-08-19 DIAGNOSIS — R600 Localized edema: Secondary | ICD-10-CM | POA: Diagnosis not present

## 2020-08-19 DIAGNOSIS — R262 Difficulty in walking, not elsewhere classified: Secondary | ICD-10-CM | POA: Diagnosis not present

## 2020-08-19 NOTE — Telephone Encounter (Signed)
Submitted VOB, Durolane, left knee.  
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
## 2020-08-21 ENCOUNTER — Other Ambulatory Visit: Payer: Self-pay | Admitting: Urology

## 2020-08-21 ENCOUNTER — Ambulatory Visit: Payer: Medicare Other | Admitting: Physical Therapy

## 2020-08-21 ENCOUNTER — Encounter: Payer: Self-pay | Admitting: Physical Therapy

## 2020-08-21 ENCOUNTER — Other Ambulatory Visit: Payer: Self-pay

## 2020-08-21 DIAGNOSIS — R600 Localized edema: Secondary | ICD-10-CM

## 2020-08-21 DIAGNOSIS — M25662 Stiffness of left knee, not elsewhere classified: Secondary | ICD-10-CM | POA: Diagnosis not present

## 2020-08-21 DIAGNOSIS — M6281 Muscle weakness (generalized): Secondary | ICD-10-CM | POA: Diagnosis not present

## 2020-08-21 DIAGNOSIS — M25562 Pain in left knee: Secondary | ICD-10-CM

## 2020-08-21 DIAGNOSIS — G8929 Other chronic pain: Secondary | ICD-10-CM | POA: Diagnosis not present

## 2020-08-21 DIAGNOSIS — R262 Difficulty in walking, not elsewhere classified: Secondary | ICD-10-CM | POA: Diagnosis not present

## 2020-08-21 DIAGNOSIS — M25551 Pain in right hip: Secondary | ICD-10-CM | POA: Diagnosis not present

## 2020-08-21 DIAGNOSIS — R2689 Other abnormalities of gait and mobility: Secondary | ICD-10-CM | POA: Diagnosis not present
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
## 2020-08-24 ENCOUNTER — Ambulatory Visit (INDEPENDENT_AMBULATORY_CARE_PROVIDER_SITE_OTHER): Payer: Medicare Other | Admitting: Physical Therapy

## 2020-08-24 ENCOUNTER — Other Ambulatory Visit: Payer: Self-pay

## 2020-08-24 DIAGNOSIS — R2689 Other abnormalities of gait and mobility: Secondary | ICD-10-CM | POA: Diagnosis not present

## 2020-08-24 DIAGNOSIS — M25662 Stiffness of left knee, not elsewhere classified: Secondary | ICD-10-CM | POA: Diagnosis not present

## 2020-08-24 DIAGNOSIS — M25551 Pain in right hip: Secondary | ICD-10-CM | POA: Diagnosis not present

## 2020-08-24 DIAGNOSIS — R600 Localized edema: Secondary | ICD-10-CM

## 2020-08-24 DIAGNOSIS — R262 Difficulty in walking, not elsewhere classified: Secondary | ICD-10-CM | POA: Diagnosis not present

## 2020-08-24 DIAGNOSIS — M6281 Muscle weakness (generalized): Secondary | ICD-10-CM | POA: Diagnosis not present

## 2020-08-24 DIAGNOSIS — G8929 Other chronic pain: Secondary | ICD-10-CM | POA: Diagnosis not present

## 2020-08-24 DIAGNOSIS — M25562 Pain in left knee: Secondary | ICD-10-CM | POA: Diagnosis not present
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
## 2020-08-25 ENCOUNTER — Other Ambulatory Visit: Payer: Self-pay | Admitting: Urology

## 2020-08-26 ENCOUNTER — Other Ambulatory Visit: Payer: Self-pay

## 2020-08-26 ENCOUNTER — Ambulatory Visit (INDEPENDENT_AMBULATORY_CARE_PROVIDER_SITE_OTHER): Payer: Medicare Other | Admitting: Rehabilitative and Restorative Service Providers"

## 2020-08-26 ENCOUNTER — Encounter: Payer: Self-pay | Admitting: Rehabilitative and Restorative Service Providers"

## 2020-08-26 DIAGNOSIS — R296 Repeated falls: Secondary | ICD-10-CM

## 2020-08-26 DIAGNOSIS — M25662 Stiffness of left knee, not elsewhere classified: Secondary | ICD-10-CM | POA: Diagnosis not present

## 2020-08-26 DIAGNOSIS — G8929 Other chronic pain: Secondary | ICD-10-CM | POA: Diagnosis not present

## 2020-08-26 DIAGNOSIS — M25562 Pain in left knee: Secondary | ICD-10-CM | POA: Diagnosis not present

## 2020-08-26 DIAGNOSIS — M6281 Muscle weakness (generalized): Secondary | ICD-10-CM | POA: Diagnosis not present

## 2020-08-26 DIAGNOSIS — R600 Localized edema: Secondary | ICD-10-CM | POA: Diagnosis not present

## 2020-08-26 DIAGNOSIS — R262 Difficulty in walking, not elsewhere classified: Secondary | ICD-10-CM
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
## 2020-08-26 NOTE — Patient Instructions (Signed)
Access Code: [email or web address] URL: [email or web address] Date: 08/26/2020 Prepared by: Vista Mink  Exercises Tandem Stance - 1 x daily - 7 x weekly - 1 sets - 5 reps - 20 second hold Sit to Stand with Armchair - 1 x daily - 7 x weekly - 1 sets - 10 reps Step Up - 1 x daily - 7 x weekly - 2 sets - 10 reps

## 2020-08-27 ENCOUNTER — Telehealth: Payer: Self-pay

## 2020-08-27 DIAGNOSIS — E7800 Pure hypercholesterolemia, unspecified: Secondary | ICD-10-CM | POA: Diagnosis not present

## 2020-08-27 DIAGNOSIS — N319 Neuromuscular dysfunction of bladder, unspecified: Secondary | ICD-10-CM | POA: Diagnosis not present

## 2020-08-27 DIAGNOSIS — Z0000 Encounter for general adult medical examination without abnormal findings: Secondary | ICD-10-CM | POA: Diagnosis not present

## 2020-08-27 DIAGNOSIS — G4733 Obstructive sleep apnea (adult) (pediatric): Secondary | ICD-10-CM | POA: Diagnosis not present

## 2020-08-27 DIAGNOSIS — G8254 Quadriplegia, C5-C7 incomplete: Secondary | ICD-10-CM | POA: Diagnosis not present

## 2020-08-27 DIAGNOSIS — R600 Localized edema: Secondary | ICD-10-CM | POA: Diagnosis not present

## 2020-08-27 DIAGNOSIS — J309 Allergic rhinitis, unspecified: Secondary | ICD-10-CM | POA: Diagnosis not present

## 2020-08-27 NOTE — Telephone Encounter (Signed)
Approved for Durolane, left knee. [place] Patient will be responsible for 20% OOP. Co-pay of $45.00 No PA required  Appt. 09/01/2020 with Dr. Erlinda Hong.

## 2020-08-31 DIAGNOSIS — N3020 Other chronic cystitis without hematuria: Secondary | ICD-10-CM | POA: Diagnosis not present

## 2020-08-31 DIAGNOSIS — R3914 Feeling of incomplete bladder emptying: Secondary | ICD-10-CM | POA: Diagnosis not present

## 2020-09-01 ENCOUNTER — Ambulatory Visit (INDEPENDENT_AMBULATORY_CARE_PROVIDER_SITE_OTHER): Payer: Medicare Other | Admitting: Rehabilitative and Restorative Service Providers"

## 2020-09-01 ENCOUNTER — Ambulatory Visit: Payer: Medicare Other | Admitting: Orthopaedic Surgery

## 2020-09-01 ENCOUNTER — Other Ambulatory Visit: Payer: Self-pay

## 2020-09-01 ENCOUNTER — Encounter: Payer: Self-pay | Admitting: Rehabilitative and Restorative Service Providers"

## 2020-09-01 DIAGNOSIS — M25562 Pain in left knee: Secondary | ICD-10-CM

## 2020-09-01 DIAGNOSIS — G8929 Other chronic pain: Secondary | ICD-10-CM | POA: Diagnosis not present

## 2020-09-01 DIAGNOSIS — R262 Difficulty in walking, not elsewhere classified: Secondary | ICD-10-CM

## 2020-09-01 DIAGNOSIS — R296 Repeated falls: Secondary | ICD-10-CM | POA: Diagnosis not present

## 2020-09-01 DIAGNOSIS — M6281 Muscle weakness (generalized): Secondary | ICD-10-CM | POA: Diagnosis not present
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
## 2020-09-03 ENCOUNTER — Ambulatory Visit (INDEPENDENT_AMBULATORY_CARE_PROVIDER_SITE_OTHER): Payer: Medicare Other | Admitting: Rehabilitative and Restorative Service Providers"

## 2020-09-03 ENCOUNTER — Encounter: Payer: Self-pay | Admitting: Rehabilitative and Restorative Service Providers"

## 2020-09-03 ENCOUNTER — Other Ambulatory Visit: Payer: Self-pay

## 2020-09-03 DIAGNOSIS — M6281 Muscle weakness (generalized): Secondary | ICD-10-CM | POA: Diagnosis not present

## 2020-09-03 DIAGNOSIS — R296 Repeated falls: Secondary | ICD-10-CM

## 2020-09-03 DIAGNOSIS — G8929 Other chronic pain: Secondary | ICD-10-CM | POA: Diagnosis not present

## 2020-09-03 DIAGNOSIS — M25662 Stiffness of left knee, not elsewhere classified: Secondary | ICD-10-CM

## 2020-09-03 DIAGNOSIS — R262 Difficulty in walking, not elsewhere classified: Secondary | ICD-10-CM

## 2020-09-03 DIAGNOSIS — R600 Localized edema: Secondary | ICD-10-CM | POA: Diagnosis not present

## 2020-09-03 DIAGNOSIS — M25562 Pain in left knee: Secondary | ICD-10-CM | POA: Diagnosis not present

## 2020-09-03 NOTE — Patient Instructions (Signed)
Access Code: [email or web address] URL: [email or web address] Date: 09/03/2020 Prepared by: Vista Mink  Exercises Tandem Stance - 1 x daily - 7 x weekly - 1 sets - 5 reps - 20 second hold Sit to Stand with Armchair - 1 x daily - 7 x weekly - 1 sets - 10 reps Step Up - 1 x daily - 7 x weekly - 2 sets - 10 reps
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
## 2020-09-08 ENCOUNTER — Encounter: Payer: Medicare Other | Admitting: Physical Therapy

## 2020-09-08 ENCOUNTER — Encounter: Payer: Self-pay | Admitting: Orthopaedic Surgery

## 2020-09-08 ENCOUNTER — Ambulatory Visit: Payer: Medicare Other | Admitting: Orthopaedic Surgery

## 2020-09-08 DIAGNOSIS — M1712 Unilateral primary osteoarthritis, left knee: Secondary | ICD-10-CM | POA: Diagnosis not present

## 2020-09-08 MED ORDER — BUPIVACAINE HCL 0.5 % IJ SOLN
2.0000 mL | INTRAMUSCULAR | Status: AC | PRN
  Administered 2020-09-08: 2 mL via INTRA_ARTICULAR

## 2020-09-08 MED ORDER — LIDOCAINE HCL 1 % IJ SOLN
2.0000 mL | INTRAMUSCULAR | Status: AC | PRN
  Administered 2020-09-08: 2 mL

## 2020-09-08 MED ORDER — SODIUM HYALURONATE 60 MG/3ML IX PRSY
60.0000 mg | PREFILLED_SYRINGE | INTRA_ARTICULAR | Status: AC | PRN
  Administered 2020-09-08: 60 mg via INTRA_ARTICULAR
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
## 2020-09-10 ENCOUNTER — Encounter: Payer: Medicare Other | Admitting: Physical Therapy

## 2020-09-16 DIAGNOSIS — R310 Gross hematuria: Secondary | ICD-10-CM | POA: Diagnosis not present

## 2020-09-21 ENCOUNTER — Encounter: Payer: Medicare Other | Admitting: Physical Therapy

## 2020-10-13 DIAGNOSIS — G4733 Obstructive sleep apnea (adult) (pediatric): Secondary | ICD-10-CM | POA: Diagnosis not present

## 2020-10-14 ENCOUNTER — Ambulatory Visit: Payer: Medicare Other | Admitting: Orthopaedic Surgery

## 2020-10-14 ENCOUNTER — Telehealth: Payer: Self-pay | Admitting: Family Medicine

## 2020-10-14 ENCOUNTER — Other Ambulatory Visit: Payer: Self-pay

## 2020-10-14 DIAGNOSIS — M1712 Unilateral primary osteoarthritis, left knee: Secondary | ICD-10-CM
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
## 2020-10-14 NOTE — Telephone Encounter (Signed)
Pt would like a call to discuss coming off of the CPAP completely, please call

## 2020-10-14 NOTE — Telephone Encounter (Signed)
Called pt and he stated that he feels like he is more rested when not using cpap.  No weight loss.  I relayed that can do download and see what AL/NP thinks.  Has annual appt 12-28-20, may move appt up depending availability.  He was ok to proceed this way.
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
## 2020-10-15 NOTE — Telephone Encounter (Addendum)
LMVM for pt to return call back.

## 2020-10-15 NOTE — Telephone Encounter (Signed)
Spoke to pt and relayed amy's message.  I made appt for move up his annual to discuss other options.  10-20-20 at 1300.

## 2020-10-20 ENCOUNTER — Encounter: Payer: Self-pay | Admitting: Family Medicine

## 2020-10-20 ENCOUNTER — Ambulatory Visit: Payer: Medicare Other | Admitting: Family Medicine

## 2020-10-20 VITALS — BP 103/62 | HR 73 | Ht 68.0 in | Wt 180.0 lb

## 2020-10-20 DIAGNOSIS — G4733 Obstructive sleep apnea (adult) (pediatric): Secondary | ICD-10-CM

## 2020-10-20 DIAGNOSIS — Z9989 Dependence on other enabling machines and devices: Secondary | ICD-10-CM
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
## 2020-10-20 NOTE — Patient Instructions (Signed)
Please continue using your CPAP regularly. While your insurance requires that you use CPAP at least 4 hours each night on 70% of the nights, I recommend, that you not skip any nights and use it throughout the night if you can. Getting used to CPAP and staying with the treatment long term does take time and patience and discipline. Untreated obstructive sleep apnea when it is moderate to severe can have an adverse impact on cardiovascular health and raise her risk for heart disease, arrhythmias, hypertension, congestive heart failure, stroke and diabetes. Untreated obstructive sleep apnea causes sleep disruption, nonrestorative sleep, and sleep deprivation. This can have an impact on your day to day functioning and cause daytime sleepiness and impairment of cognitive function, memory loss, mood disturbance, and problems focussing. Using CPAP regularly can improve these symptoms.   I want you to try melatonin 5mg  every night about 30 minutes before bedtime. Use sleep hygiene techniques we have discussed to see if this helps with sleep routine. Continue CPAP. Let's follow up in 3 months to see how you are doing. If you continue to have concerns we will consider referral for either Inspire or dental device.    Quality Sleep Information, Adult Quality sleep is important for your mental and physical health. It also improves your quality of life. Quality sleep means you:  Are asleep for most of the time you are in bed.  Fall asleep within 30 minutes.  Wake up no more than once a night.  Are awake for no longer than 20 minutes if you do wake up during the night. Most adults need 7-8 hours of quality sleep each night. How can poor sleep affect me? If you do not get enough quality sleep, you may have:  Mood swings.  Daytime sleepiness.  Confusion.  Decreased reaction time.  Sleep disorders, such as insomnia and sleep apnea.  Difficulty with: ? Solving problems. ? Coping with  stress. ? Paying attention. These issues may affect your performance and productivity at work, school, and at home. Lack of sleep may also put you at higher risk for accidents, suicide, and risky behaviors. If you do not get quality sleep you may also be at higher risk for several health problems, including:  Infections.  Type 2 diabetes.  Heart disease.  High blood pressure.  Obesity.  Worsening of long-term conditions, like arthritis, kidney disease, depression, Parkinson's disease, and epilepsy. What actions can I take to get more quality sleep?  Stick to a sleep schedule. Go to sleep and wake up at about the same time each day. Do not try to sleep less on weekdays and make up for lost sleep on weekends. This does not work.  Try to get about 30 minutes of exercise on most days. Do not exercise 2-3 hours before going to bed.  Limit naps during the day to 30 minutes or less.  Do not use any products that contain nicotine or tobacco, such as cigarettes or e-cigarettes. If you need help quitting, ask your health care provider.  Do not drink caffeinated beverages for at least 8 hours before going to bed. Coffee, tea, and some sodas contain caffeine.  Do not drink alcohol close to bedtime.  Do not eat large meals close to bedtime.  Do not take naps in the late afternoon.  Try to get at least 30 minutes of sunlight every day. Morning sunlight is best.  Make time to relax before bed. Reading, listening to music, or taking a hot bath  promotes quality sleep.  Make your bedroom a place that promotes quality sleep. Keep your bedroom dark, quiet, and at a comfortable room temperature. Make sure your bed is comfortable. Take out sleep distractions like TV, a computer, smartphone, and bright lights.  If you are lying awake in bed for longer than 20 minutes, get up and do a relaxing activity until you feel sleepy.  Work with your health care provider to treat medical conditions that may  affect sleeping, such as: ? Nasal obstruction. ? Snoring. ? Sleep apnea and other sleep disorders.  Talk to your health care provider if you think any of your prescription medicines may cause you to have difficulty falling or staying asleep.  If you have sleep problems, talk with a sleep consultant. If you think you have a sleep disorder, talk with your health care provider about getting evaluated by a specialist.      Where to find more information  [place] website: https://sleepfoundation.org  National Heart, Lung, and Center [place] ([place]): http://www.saunders.info/.pdf  Centers for Disease Control and Prevention (CDC): LearningDermatology.pl Contact a health care provider if you:  Have trouble getting to sleep or staying asleep.  Often wake up very early in the morning and cannot get back to sleep.  Have daytime sleepiness.  Have daytime sleep attacks of suddenly falling asleep and sudden muscle weakness (narcolepsy).  Have a tingling sensation in your legs with a strong urge to move your legs (restless legs syndrome).  Stop breathing briefly during sleep (sleep apnea).  Think you have a sleep disorder or are taking a medicine that is affecting your quality of sleep. Summary  Most adults need 7-8 hours of quality sleep each night.  Getting enough quality sleep is an important part of health and well-being.  Make your bedroom a place that promotes quality sleep and avoid things that may cause you to have poor sleep, such as alcohol, caffeine, smoking, and large meals.  Talk to your health care provider if you have trouble falling asleep or staying asleep. This information is not intended to replace advice given to you by your health care provider. Make sure you discuss any questions you have with your health care provider. Document Revised: 10/11/2017 Document Reviewed: 10/11/2017 Elsevier Patient Education  2021  [place].   Sleep Apnea Sleep apnea affects breathing during sleep. It causes breathing to stop for a short time or to become shallow. It can also increase the risk of:  Heart attack.  Stroke.  Being very overweight (obese).  Diabetes.  Heart failure.  Irregular heartbeat. The goal of treatment is to help you breathe normally again. What are the causes? There are three kinds of sleep apnea:  Obstructive sleep apnea. This is caused by a blocked or collapsed airway.  Central sleep apnea. This happens when the brain does not send the right signals to the muscles that control breathing.  Mixed sleep apnea. This is a combination of obstructive and central sleep apnea. The most common cause of this condition is a collapsed or blocked airway. This can happen if:  Your throat muscles are too relaxed.  Your tongue and tonsils are too large.  You are overweight.  Your airway is too small.   What increases the risk?  Being overweight.  Smoking.  Having a small airway.  Being older.  Being male.  Drinking alcohol.  Taking medicines to calm yourself (sedatives or tranquilizers).  Having family members with the condition. What are the signs or  symptoms?  Trouble staying asleep.  Being sleepy or tired during the day.  Getting angry a lot.  Loud snoring.  Headaches in the morning.  Not being able to focus your mind (concentrate).  Forgetting things.  Less interest in sex.  Mood swings.  Personality changes.  Feelings of sadness (depression).  Waking up a lot during the night to pee (urinate).  Dry mouth.  Sore throat. How is this diagnosed?  Your medical history.  A physical exam.  A test that is done when you are sleeping (sleep study). The test is most often done in a sleep lab but may also be done at home. How is this treated?  Sleeping on your side.  Using a medicine to get rid of mucus in your nose (decongestant).  Avoiding the  use of alcohol, medicines to help you relax, or certain pain medicines (narcotics).  Losing weight, if needed.  Changing your diet.  Not smoking.  Using a machine to open your airway while you sleep, such as: ? An oral appliance. This is a mouthpiece that shifts your lower jaw forward. ? A CPAP device. This device blows air through a mask when you breathe out (exhale). ? An EPAP device. This has valves that you put in each nostril. ? A BPAP device. This device blows air through a mask when you breathe in (inhale) and breathe out.  Having surgery if other treatments do not work. It is important to get treatment for sleep apnea. Without treatment, it can lead to:  High blood pressure.  Coronary artery disease.  In men, not being able to have an erection (impotence).  Reduced thinking ability.   Follow these instructions at home: Lifestyle  Make changes that your doctor recommends.  Eat a healthy diet.  Lose weight if needed.  Avoid alcohol, medicines to help you relax, and some pain medicines.  Do not use any products that contain nicotine or tobacco, such as cigarettes, e-cigarettes, and chewing tobacco. If you need help quitting, ask your doctor. General instructions  Take over-the-counter and prescription medicines only as told by your doctor.  If you were given a machine to use while you sleep, use it only as told by your doctor.  If you are having surgery, make sure to tell your doctor you have sleep apnea. You may need to bring your device with you.  Keep all follow-up visits as told by your doctor. This is important. Contact a doctor if:  The machine that you were given to use during sleep bothers you or does not seem to be working.  You do not get better.  You get worse. Get help right away if:  Your chest hurts.  You have trouble breathing in enough air.  You have an uncomfortable feeling in your back, arms, or stomach.  You have trouble  talking.  One side of your body feels weak.  A part of your face is hanging down. These symptoms may be an emergency. Do not wait to see if the symptoms will go away. Get medical help right away. Call your local emergency services (911 in the [place] Do not drive yourself to the hospital. Summary  This condition affects breathing during sleep.  The most common cause is a collapsed or blocked airway.  The goal of treatment is to help you breathe normally while you sleep. This information is not intended to replace advice given to you by your health care provider. Make sure you discuss any questions you  have with your health care provider. Document Revised: 04/20/2018 Document Reviewed: 02/27/2018 Elsevier Patient Education  [place].

## 2020-11-18 DIAGNOSIS — N3021 Other chronic cystitis with hematuria: Secondary | ICD-10-CM | POA: Diagnosis not present

## 2020-11-18 DIAGNOSIS — R3914 Feeling of incomplete bladder emptying: Secondary | ICD-10-CM | POA: Diagnosis not present

## 2020-12-07 ENCOUNTER — Telehealth: Payer: Self-pay | Admitting: Family Medicine

## 2020-12-07 NOTE — Telephone Encounter (Signed)
I called and pt will call DME back regarding the cpap supplies that he needs.

## 2020-12-07 NOTE — Telephone Encounter (Signed)
Pt states he messed up the tube to his CPAP and he has been waiting for about a week.  He has been unable to use his CPAP since then.  Pt is asking for a call as to what he needs to do.

## 2020-12-08 DIAGNOSIS — G4733 Obstructive sleep apnea (adult) (pediatric): Secondary | ICD-10-CM | POA: Diagnosis not present

## 2020-12-28 ENCOUNTER — Ambulatory Visit: Payer: Medicare Other | Admitting: Family Medicine

## 2021-01-20 ENCOUNTER — Ambulatory Visit: Payer: Medicare Other | Admitting: Family Medicine

## 2021-01-26 ENCOUNTER — Ambulatory Visit: Payer: Medicare Other | Admitting: Family Medicine

## 2021-02-03 DIAGNOSIS — M869 Osteomyelitis, unspecified: Secondary | ICD-10-CM | POA: Diagnosis not present

## 2021-02-04 ENCOUNTER — Encounter: Payer: Self-pay | Admitting: Orthopaedic Surgery

## 2021-02-04 ENCOUNTER — Ambulatory Visit: Payer: Medicare Other | Admitting: Orthopaedic Surgery

## 2021-02-04 ENCOUNTER — Other Ambulatory Visit: Payer: Self-pay

## 2021-02-04 DIAGNOSIS — M1712 Unilateral primary osteoarthritis, left knee: Secondary | ICD-10-CM

## 2021-02-04 MED ORDER — METHYLPREDNISOLONE ACETATE 40 MG/ML IJ SUSP
40.0000 mg | INTRAMUSCULAR | Status: AC | PRN
  Administered 2021-02-04: 40 mg via INTRA_ARTICULAR

## 2021-02-04 MED ORDER — LIDOCAINE HCL 1 % IJ SOLN
2.0000 mL | INTRAMUSCULAR | Status: AC | PRN
  Administered 2021-02-04: 2 mL

## 2021-02-04 MED ORDER — BUPIVACAINE HCL 0.5 % IJ SOLN
2.0000 mL | INTRAMUSCULAR | Status: AC | PRN
  Administered 2021-02-04: 2 mL via INTRA_ARTICULAR
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
## 2021-02-15 DIAGNOSIS — R42 Dizziness and giddiness: Secondary | ICD-10-CM | POA: Diagnosis not present

## 2021-02-15 DIAGNOSIS — I950 Idiopathic hypotension: Secondary | ICD-10-CM | POA: Diagnosis not present

## 2021-02-16 ENCOUNTER — Ambulatory Visit: Payer: Medicare Other | Admitting: Family Medicine

## 2021-02-23 DIAGNOSIS — R42 Dizziness and giddiness: Secondary | ICD-10-CM | POA: Diagnosis not present

## 2021-02-23 DIAGNOSIS — R600 Localized edema: Secondary | ICD-10-CM | POA: Diagnosis not present

## 2021-02-23 DIAGNOSIS — M869 Osteomyelitis, unspecified: Secondary | ICD-10-CM | POA: Diagnosis not present
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
## 2021-03-04 NOTE — Patient Instructions (Addendum)
Please continue using your CPAP regularly. While your insurance requires that you use CPAP at least 4 hours each night on 70% of the nights, I recommend, that you not skip any nights and use it throughout the night if you can. Getting used to CPAP and staying with the treatment long term does take time and patience and discipline. Untreated obstructive sleep apnea when it is moderate to severe can have an adverse impact on cardiovascular health and raise her risk for heart disease, arrhythmias, hypertension, congestive heart failure, stroke and diabetes. Untreated obstructive sleep apnea causes sleep disruption, nonrestorative sleep, and sleep deprivation. This can have an impact on your day to day functioning and cause daytime sleepiness and impairment of cognitive function, memory loss, mood disturbance, and problems focussing. Using CPAP regularly can improve these symptoms.   Follow up in 1 year   Management of Memory Problems   There are some general things you can do to help manage your memory problems.  Your memory may not in fact recover, but by using techniques and strategies you will be able to manage your memory difficulties better.   1)  Establish a routine. Try to establish and then stick to a regular routine.  By doing this, you will get used to what to expect and you will reduce the need to rely on your memory.  Also, try to do things at the same time of day, such as taking your medication or checking your calendar first thing in the morning. Think about think that you can do as a part of a regular routine and make a list.  Then enter them into a daily planner to remind you.  This will help you establish a routine.   2)  Organize your environment. Organize your environment so that it is uncluttered.  Decrease visual stimulation.  Place everyday items such as keys or cell phone in the same place every day (ie.  Basket next to front door) Use post it notes with a brief message to yourself  (ie. Turn off light, lock the door) Use labels to indicate where things go (ie. Which cupboards are for food, dishes, etc.) Keep a notepad and pen by the telephone to take messages   3)  Memory Aids A diary or journal/notebook/daily planner Making a list (shopping list, chore list, to do list that needs to be done) Using an alarm as a reminder (kitchen timer or cell phone alarm) Using cell phone to store information (Notes, Calendar, Reminders) Calendar/White board placed in a prominent position Post-it notes   In order for memory aids to be useful, you need to have good habits.  It's no good remembering to make a note in your journal if you don't remember to look in it.  Try setting aside a certain time of day to look in journal.   4)  Improving mood and managing fatigue. There may be other factors that contribute to memory difficulties.  Factors, such as anxiety, depression and tiredness can affect memory. Regular gentle exercise can help improve your mood and give you more energy. Simple relaxation techniques may help relieve symptoms of anxiety Try to get back to completing activities or hobbies you enjoyed doing in the past. Learn to pace yourself through activities to decrease fatigue. Find out about some local support groups where you can share experiences with others. Try and achieve 7-8 hours of sleep at night.

## 2021-03-08 DIAGNOSIS — R3914 Feeling of incomplete bladder emptying: Secondary | ICD-10-CM | POA: Diagnosis not present

## 2021-03-09 ENCOUNTER — Other Ambulatory Visit: Payer: Self-pay

## 2021-03-09 ENCOUNTER — Encounter: Payer: Self-pay | Admitting: Family Medicine

## 2021-03-09 ENCOUNTER — Ambulatory Visit: Payer: Medicare Other | Admitting: Family Medicine

## 2021-03-09 VITALS — BP 118/67 | HR 64 | Ht 68.0 in | Wt 180.0 lb

## 2021-03-09 DIAGNOSIS — G4733 Obstructive sleep apnea (adult) (pediatric): Secondary | ICD-10-CM | POA: Diagnosis not present

## 2021-03-09 DIAGNOSIS — Z9989 Dependence on other enabling machines and devices: Secondary | ICD-10-CM | POA: Diagnosis not present

## 2021-03-12 DIAGNOSIS — R339 Retention of urine, unspecified: Secondary | ICD-10-CM | POA: Diagnosis not present

## 2021-03-25 DIAGNOSIS — N3020 Other chronic cystitis without hematuria: Secondary | ICD-10-CM | POA: Diagnosis not present

## 2021-04-05 DIAGNOSIS — N3020 Other chronic cystitis without hematuria: Secondary | ICD-10-CM | POA: Diagnosis not present

## 2021-04-15 DIAGNOSIS — G4733 Obstructive sleep apnea (adult) (pediatric): Secondary | ICD-10-CM | POA: Diagnosis not present

## 2021-05-01 DIAGNOSIS — Z23 Encounter for immunization: Secondary | ICD-10-CM | POA: Diagnosis not present

## 2021-05-17 ENCOUNTER — Telehealth: Payer: Self-pay | Admitting: Neurology

## 2021-05-17 NOTE — Telephone Encounter (Signed)
I called pt and relayed that referral signed for dentistry by Dr. Rexene Alberts.  Will fax to Dr. Cletis [place].  I relayed that last visit showed compliance and no issues.  He stated that was correct.  He is interested to see if dentistry may be better fit for him, instead of using mask/machine, and cost as well.  I relayed will fax to them.  He appreicated call back.

## 2021-05-17 NOTE — Telephone Encounter (Signed)
I received a form from [facility], in particular, Dr. Jola Schmidt for referral to dentistry for sleep apnea management with an oral appliance. Patient reportedly has difficulty tolerating his CPAP.  Of note, last appointment in August 2022 with Debbora Presto, NP showed good compliance and no significant difficulty were reported by the patient at the time.  He was advised to follow-up routinely in 1 year.  I did sign the referral form.

## 2021-05-19 DIAGNOSIS — R3914 Feeling of incomplete bladder emptying: Secondary | ICD-10-CM | POA: Diagnosis not present

## 2021-05-19 DIAGNOSIS — N3020 Other chronic cystitis without hematuria: Secondary | ICD-10-CM | POA: Diagnosis not present

## 2021-05-20 ENCOUNTER — Encounter: Payer: Self-pay | Admitting: *Deleted

## 2021-05-20 NOTE — Telephone Encounter (Signed)
[facility] requested note from provider to support request for sleep device. This was completed by Amy NP and all requested info faxed back to [facility]. Received a receipt of confirmation.

## 2021-05-28 ENCOUNTER — Telehealth: Payer: Self-pay | Admitting: Family Medicine

## 2021-05-28 NOTE — Telephone Encounter (Signed)
[facility] Lelon Frohlich) On the physician order form need physician to type in the narrative note "Patient cannot tolerate CPAP. And refax form to the same number.

## 2021-05-31 NOTE — Telephone Encounter (Signed)
We would not be using  [facility].  There is no documentation from the patient indicating that he is interested in dental device. The last OV pt was tolerating CPAP well. Dr Rexene Alberts would have to take a look at his study again to determine if dental device is appropriate treatment. Then if so, we would use a local dentist to complete this.

## 2021-06-09 DIAGNOSIS — N3020 Other chronic cystitis without hematuria: Secondary | ICD-10-CM | POA: Diagnosis not present

## 2021-06-24 ENCOUNTER — Other Ambulatory Visit: Payer: Self-pay

## 2021-06-24 ENCOUNTER — Ambulatory Visit: Payer: Medicare Other | Admitting: Orthopaedic Surgery

## 2021-06-24 ENCOUNTER — Ambulatory Visit: Payer: Self-pay

## 2021-06-24 DIAGNOSIS — M25561 Pain in right knee: Secondary | ICD-10-CM

## 2021-06-24 DIAGNOSIS — G8929 Other chronic pain: Secondary | ICD-10-CM | POA: Diagnosis not present
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
## 2021-06-25 MED ORDER — LIDOCAINE HCL 1 % IJ SOLN
2.0000 mL | INTRAMUSCULAR | Status: AC | PRN
  Administered 2021-06-24: 2 mL

## 2021-06-25 MED ORDER — BUPIVACAINE HCL 0.25 % IJ SOLN
2.0000 mL | INTRAMUSCULAR | Status: AC | PRN
  Administered 2021-06-24: 2 mL via INTRA_ARTICULAR

## 2021-06-25 MED ORDER — METHYLPREDNISOLONE ACETATE 40 MG/ML IJ SUSP
40.0000 mg | INTRAMUSCULAR | Status: AC | PRN
  Administered 2021-06-24: 40 mg via INTRA_ARTICULAR

## 2021-06-30 NOTE — Telephone Encounter (Signed)
Pt has called back with [facility] on the line.  Pt states now that he is not tolerating the CPAP well and would like ot see about a dental devise, please call pt to advise what Dr Rexene Alberts has to say on this request.

## 2021-07-01 NOTE — Telephone Encounter (Signed)
I called pt and LMVM for him to return call about cpap and dental device.

## 2021-07-01 NOTE — Telephone Encounter (Signed)
I called and pulled cpap DL and placed in inbox to review.

## 2021-07-05 NOTE — Telephone Encounter (Signed)
I called pt to follow up on the cpap tolerance.   I asked if he felt like  he was intolerant of cpap. He states that he has issues off and on (sometimes he does not sleep well, has sinus problem). I relayed that per his last ofv note and most recent cpap DL, that he is tolerant of using his cpap, and he can pay out of pocket for dental device if his insurance does not approve.  I relayed that his AHI could be tweaked since 9.8 but he is ok for now.  Will call back as needed.  May address when in the next appt in 02-2022.  He appreciated call back.

## 2021-07-07 ENCOUNTER — Encounter: Payer: Self-pay | Admitting: Orthopaedic Surgery

## 2021-07-07 ENCOUNTER — Other Ambulatory Visit: Payer: Self-pay

## 2021-07-07 ENCOUNTER — Ambulatory Visit: Payer: Self-pay

## 2021-07-07 ENCOUNTER — Ambulatory Visit: Payer: Medicare Other | Admitting: Orthopaedic Surgery

## 2021-07-07 DIAGNOSIS — M1712 Unilateral primary osteoarthritis, left knee: Secondary | ICD-10-CM | POA: Diagnosis not present

## 2021-07-07 DIAGNOSIS — E7800 Pure hypercholesterolemia, unspecified: Secondary | ICD-10-CM | POA: Diagnosis not present

## 2021-07-07 MED ORDER — METHYLPREDNISOLONE ACETATE 40 MG/ML IJ SUSP
40.0000 mg | INTRAMUSCULAR | Status: AC | PRN
  Administered 2021-07-07: 09:00:00 40 mg via INTRA_ARTICULAR

## 2021-07-07 MED ORDER — BUPIVACAINE HCL 0.5 % IJ SOLN
2.0000 mL | INTRAMUSCULAR | Status: AC | PRN
  Administered 2021-07-07: 09:00:00 2 mL via INTRA_ARTICULAR

## 2021-07-07 MED ORDER — LIDOCAINE HCL 1 % IJ SOLN
2.0000 mL | INTRAMUSCULAR | Status: AC | PRN
Start: 2021-07-07 — End: 2021-07-07
  Administered 2021-07-07: 09:00:00 2 mL
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
## 2021-08-12 DIAGNOSIS — Z0289 Encounter for other administrative examinations: Secondary | ICD-10-CM

## 2021-09-07 DIAGNOSIS — Z0000 Encounter for general adult medical examination without abnormal findings: Secondary | ICD-10-CM | POA: Diagnosis not present

## 2021-09-07 DIAGNOSIS — N319 Neuromuscular dysfunction of bladder, unspecified: Secondary | ICD-10-CM | POA: Diagnosis not present

## 2021-09-07 DIAGNOSIS — J309 Allergic rhinitis, unspecified: Secondary | ICD-10-CM | POA: Diagnosis not present

## 2021-09-07 DIAGNOSIS — G8254 Quadriplegia, C5-C7 incomplete: Secondary | ICD-10-CM | POA: Diagnosis not present

## 2021-09-07 DIAGNOSIS — R600 Localized edema: Secondary | ICD-10-CM | POA: Diagnosis not present

## 2021-09-07 DIAGNOSIS — G4733 Obstructive sleep apnea (adult) (pediatric): Secondary | ICD-10-CM | POA: Diagnosis not present

## 2021-09-07 DIAGNOSIS — E7800 Pure hypercholesterolemia, unspecified: Secondary | ICD-10-CM | POA: Diagnosis not present

## 2021-10-13 DIAGNOSIS — G4733 Obstructive sleep apnea (adult) (pediatric): Secondary | ICD-10-CM | POA: Diagnosis not present

## 2021-11-02 DIAGNOSIS — M25551 Pain in right hip: Secondary | ICD-10-CM | POA: Diagnosis not present

## 2021-11-02 DIAGNOSIS — I779 Disorder of arteries and arterioles, unspecified: Secondary | ICD-10-CM | POA: Diagnosis not present

## 2021-11-02 DIAGNOSIS — M545 Low back pain, unspecified: Secondary | ICD-10-CM | POA: Diagnosis not present

## 2021-11-16 DIAGNOSIS — N3020 Other chronic cystitis without hematuria: Secondary | ICD-10-CM | POA: Diagnosis not present

## 2021-12-09 DIAGNOSIS — N3020 Other chronic cystitis without hematuria: Secondary | ICD-10-CM | POA: Diagnosis not present

## 2021-12-24 DIAGNOSIS — N3020 Other chronic cystitis without hematuria: Secondary | ICD-10-CM | POA: Diagnosis not present

## 2022-01-17 DIAGNOSIS — D122 Benign neoplasm of ascending colon: Secondary | ICD-10-CM | POA: Diagnosis not present

## 2022-01-17 DIAGNOSIS — K648 Other hemorrhoids: Secondary | ICD-10-CM | POA: Diagnosis not present

## 2022-01-17 DIAGNOSIS — Z1211 Encounter for screening for malignant neoplasm of colon: Secondary | ICD-10-CM | POA: Diagnosis not present

## 2022-01-21 DIAGNOSIS — D122 Benign neoplasm of ascending colon: Secondary | ICD-10-CM | POA: Diagnosis not present
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
## 2022-03-09 ENCOUNTER — Encounter: Payer: Self-pay | Admitting: Family Medicine

## 2022-03-09 ENCOUNTER — Ambulatory Visit: Payer: Medicare Other | Admitting: Family Medicine

## 2022-03-09 DIAGNOSIS — G4733 Obstructive sleep apnea (adult) (pediatric): Secondary | ICD-10-CM

## 2022-03-15 ENCOUNTER — Ambulatory Visit: Payer: Medicare Other | Admitting: Orthopaedic Surgery

## 2022-03-15 DIAGNOSIS — M1712 Unilateral primary osteoarthritis, left knee: Secondary | ICD-10-CM | POA: Diagnosis not present

## 2022-03-15 DIAGNOSIS — M1711 Unilateral primary osteoarthritis, right knee: Secondary | ICD-10-CM

## 2022-03-15 DIAGNOSIS — M170 Bilateral primary osteoarthritis of knee: Secondary | ICD-10-CM

## 2022-03-15 MED ORDER — LIDOCAINE HCL 1 % IJ SOLN
3.0000 mL | INTRAMUSCULAR | Status: AC | PRN
  Administered 2022-03-15: 3 mL

## 2022-03-15 MED ORDER — BUPIVACAINE HCL 0.25 % IJ SOLN
0.6600 mL | INTRAMUSCULAR | Status: AC | PRN
  Administered 2022-03-15: .66 mL via INTRA_ARTICULAR

## 2022-03-15 MED ORDER — METHYLPREDNISOLONE ACETATE 40 MG/ML IJ SUSP
13.3300 mg | INTRAMUSCULAR | Status: AC | PRN
  Administered 2022-03-15: 13.33 mg via INTRA_ARTICULAR
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
## 2022-03-29 ENCOUNTER — Encounter: Payer: Self-pay | Admitting: Family Medicine

## 2022-03-29 ENCOUNTER — Ambulatory Visit: Payer: Medicare Other | Admitting: Family Medicine

## 2022-03-29 VITALS — BP 114/71 | HR 59 | Ht 68.0 in | Wt 171.6 lb

## 2022-03-29 DIAGNOSIS — G4733 Obstructive sleep apnea (adult) (pediatric): Secondary | ICD-10-CM

## 2022-03-29 DIAGNOSIS — Z9989 Dependence on other enabling machines and devices: Secondary | ICD-10-CM

## 2022-03-29 NOTE — Patient Instructions (Addendum)
Please continue using your CPAP regularly. While your insurance requires that you use CPAP at least 4 hours each night on 70% of the nights, I recommend, that you not skip any nights and use it throughout the night if you can. Getting used to CPAP and staying with the treatment long term does take time and patience and discipline. Untreated obstructive sleep apnea when it is moderate to severe can have an adverse impact on cardiovascular health and raise her risk for heart disease, arrhythmias, hypertension, congestive heart failure, stroke and diabetes. Untreated obstructive sleep apnea causes sleep disruption, nonrestorative sleep, and sleep deprivation. This can have an impact on your day to day functioning and cause daytime sleepiness and impairment of cognitive function, memory loss, mood disturbance, and problems focussing. Using CPAP regularly can improve these symptoms.  We will increase pressure setting from 13 to 15cmH20. Try to avoid sleeping on your back.   Follow up in 1 year, sooner pending next download in 6-8 weeks, if needed.
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
## 2022-04-15 DIAGNOSIS — G4733 Obstructive sleep apnea (adult) (pediatric): Secondary | ICD-10-CM | POA: Diagnosis not present

## 2022-05-06 DIAGNOSIS — I959 Hypotension, unspecified: Secondary | ICD-10-CM | POA: Diagnosis not present

## 2022-05-25 DIAGNOSIS — N3020 Other chronic cystitis without hematuria: Secondary | ICD-10-CM | POA: Diagnosis not present

## 2022-05-25 DIAGNOSIS — R3914 Feeling of incomplete bladder emptying: Secondary | ICD-10-CM | POA: Diagnosis not present

## 2022-06-01 ENCOUNTER — Telehealth: Payer: Self-pay | Admitting: Family Medicine

## 2022-06-01 DIAGNOSIS — G4733 Obstructive sleep apnea (adult) (pediatric): Secondary | ICD-10-CM

## 2022-06-01 NOTE — Telephone Encounter (Signed)
Please let Steven Foster know that I reviewed his CPAP report. His apneic events did not improve with the pressure increase. It looks like he is requiring the full pressure settings at [facility]. I am going to try once more to increase maximum pressure if he feels he can tolerate it. The machine should not deliver more air than is needed. Please remind him to try to avoid sleeping on his back. I will reprint download in 4-6 weeks and determine next steps. We may need to consider titration study.

## 2022-06-01 NOTE — Telephone Encounter (Addendum)
Tried calling pt at [phone]. Got automated message that mailbox invalid, call ended.   Tried wife (on [place]) at [phone]. LVM for her to have husband call our office

## 2022-06-01 NOTE — Telephone Encounter (Signed)
Took call from phone staff and spoke with pt. Relayed AL,NP message. He is agreeable to plan. He has had continued left shoulder pain which makes it hard for him to avoid sleeping on his back. He is going to talk further with PCP about this and possible referral to Ortho for injections. He will try and switch to sleeping on right side.   Confirmed DME: [facility]. I sent community message to them that order placed.

## 2022-09-14 DIAGNOSIS — E7800 Pure hypercholesterolemia, unspecified: Secondary | ICD-10-CM | POA: Diagnosis not present

## 2022-09-14 DIAGNOSIS — I779 Disorder of arteries and arterioles, unspecified: Secondary | ICD-10-CM | POA: Diagnosis not present

## 2022-09-14 DIAGNOSIS — J309 Allergic rhinitis, unspecified: Secondary | ICD-10-CM | POA: Diagnosis not present

## 2022-09-14 DIAGNOSIS — G8254 Quadriplegia, C5-C7 incomplete: Secondary | ICD-10-CM | POA: Diagnosis not present

## 2022-09-14 DIAGNOSIS — Z0000 Encounter for general adult medical examination without abnormal findings: Secondary | ICD-10-CM | POA: Diagnosis not present

## 2022-09-14 DIAGNOSIS — Z9989 Dependence on other enabling machines and devices: Secondary | ICD-10-CM | POA: Diagnosis not present

## 2022-09-14 DIAGNOSIS — N319 Neuromuscular dysfunction of bladder, unspecified: Secondary | ICD-10-CM | POA: Diagnosis not present

## 2022-10-03 DIAGNOSIS — M25511 Pain in right shoulder: Secondary | ICD-10-CM | POA: Diagnosis not present

## 2022-10-03 DIAGNOSIS — S46911A Strain of unspecified muscle, fascia and tendon at shoulder and upper arm level, right arm, initial encounter: Secondary | ICD-10-CM | POA: Diagnosis not present

## 2022-10-05 DIAGNOSIS — R8271 Bacteriuria: Secondary | ICD-10-CM | POA: Diagnosis not present

## 2022-10-05 DIAGNOSIS — R3914 Feeling of incomplete bladder emptying: Secondary | ICD-10-CM | POA: Diagnosis not present

## 2022-10-05 DIAGNOSIS — N3021 Other chronic cystitis with hematuria: Secondary | ICD-10-CM | POA: Diagnosis not present

## 2022-10-14 DIAGNOSIS — G4733 Obstructive sleep apnea (adult) (pediatric): Secondary | ICD-10-CM | POA: Diagnosis not present

## 2022-10-19 DIAGNOSIS — R350 Frequency of micturition: Secondary | ICD-10-CM | POA: Diagnosis not present

## 2022-10-19 DIAGNOSIS — N3021 Other chronic cystitis with hematuria: Secondary | ICD-10-CM | POA: Diagnosis not present

## 2022-10-24 DIAGNOSIS — M25511 Pain in right shoulder: Secondary | ICD-10-CM | POA: Diagnosis not present

## 2022-10-26 ENCOUNTER — Other Ambulatory Visit: Payer: Self-pay | Admitting: Sports Medicine

## 2022-10-26 ENCOUNTER — Ambulatory Visit
Admission: RE | Admit: 2022-10-26 | Discharge: 2022-10-26 | Disposition: A | Discharge status: Home / Self Care | Payer: Medicare Other | Source: Ambulatory Visit | Attending: Sports Medicine | Admitting: Sports Medicine

## 2022-10-26 DIAGNOSIS — M25511 Pain in right shoulder: Secondary | ICD-10-CM

## 2022-10-26 DIAGNOSIS — M19011 Primary osteoarthritis, right shoulder: Secondary | ICD-10-CM | POA: Diagnosis not present

## 2022-11-23 DIAGNOSIS — N3020 Other chronic cystitis without hematuria: Secondary | ICD-10-CM | POA: Diagnosis not present

## 2022-11-23 DIAGNOSIS — R350 Frequency of micturition: Secondary | ICD-10-CM | POA: Diagnosis not present

## 2022-11-24 DIAGNOSIS — M25511 Pain in right shoulder: Secondary | ICD-10-CM | POA: Diagnosis not present

## 2022-12-28 DIAGNOSIS — L0390 Cellulitis, unspecified: Secondary | ICD-10-CM | POA: Diagnosis not present

## 2022-12-29 DIAGNOSIS — M25512 Pain in left shoulder: Secondary | ICD-10-CM | POA: Diagnosis not present

## 2023-01-12 ENCOUNTER — Telehealth: Payer: Self-pay | Admitting: *Deleted

## 2023-01-12 DIAGNOSIS — Z0289 Encounter for other administrative examinations: Secondary | ICD-10-CM

## 2023-01-12 NOTE — Telephone Encounter (Signed)
Gave completed/signed DMV form back to medical records to process for pt. 

## 2023-01-16 NOTE — Telephone Encounter (Signed)
Pt dmv form faxed on 01/16/2023

## 2023-03-08 DIAGNOSIS — N3020 Other chronic cystitis without hematuria: Secondary | ICD-10-CM | POA: Diagnosis not present

## 2023-03-24 DIAGNOSIS — R3914 Feeling of incomplete bladder emptying: Secondary | ICD-10-CM | POA: Diagnosis not present
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
## 2023-03-30 ENCOUNTER — Ambulatory Visit: Payer: Medicare Other | Admitting: Family Medicine

## 2023-03-30 ENCOUNTER — Encounter: Payer: Self-pay | Admitting: Family Medicine

## 2023-03-30 DIAGNOSIS — G4733 Obstructive sleep apnea (adult) (pediatric): Secondary | ICD-10-CM

## 2023-04-19 DIAGNOSIS — N312 Flaccid neuropathic bladder, not elsewhere classified: Secondary | ICD-10-CM | POA: Diagnosis not present

## 2023-04-19 DIAGNOSIS — R3914 Feeling of incomplete bladder emptying: Secondary | ICD-10-CM | POA: Diagnosis not present

## 2023-04-19 DIAGNOSIS — N3020 Other chronic cystitis without hematuria: Secondary | ICD-10-CM | POA: Diagnosis not present

## 2023-04-19 DIAGNOSIS — N5201 Erectile dysfunction due to arterial insufficiency: Secondary | ICD-10-CM | POA: Diagnosis not present

## 2023-05-09 DIAGNOSIS — G4733 Obstructive sleep apnea (adult) (pediatric): Secondary | ICD-10-CM | POA: Diagnosis not present

## 2023-05-19 DIAGNOSIS — M25511 Pain in right shoulder: Secondary | ICD-10-CM | POA: Diagnosis not present

## 2023-05-19 DIAGNOSIS — G4730 Sleep apnea, unspecified: Secondary | ICD-10-CM | POA: Diagnosis not present

## 2023-05-19 DIAGNOSIS — K219 Gastro-esophageal reflux disease without esophagitis: Secondary | ICD-10-CM | POA: Diagnosis not present

## 2023-05-19 DIAGNOSIS — E785 Hyperlipidemia, unspecified: Secondary | ICD-10-CM | POA: Diagnosis not present

## 2023-06-12 ENCOUNTER — Telehealth: Payer: Self-pay | Admitting: Family Medicine

## 2023-06-12 NOTE — Telephone Encounter (Signed)
Form placed in Dr.Athar inbox in office to sign.   Dr.Athar Amy is out of the office this week, you are the primary provider.

## 2023-06-12 NOTE — Telephone Encounter (Signed)
Pt calling check on status DMV form dropped off 06/08/23. Would like a call when form is complete.

## 2023-06-12 NOTE — Telephone Encounter (Signed)
I called patient and left message on patient voicemail that form has not been completed yet. I told patient we will reach out to him to when ready.

## 2023-06-13 NOTE — Telephone Encounter (Signed)
Dr.Athar signed form, copy placed in pod 1 file box. Form given to Stanton Kidney, form was faxed today.

## 2023-06-14 DIAGNOSIS — N50812 Left testicular pain: Secondary | ICD-10-CM | POA: Diagnosis not present

## 2023-06-14 DIAGNOSIS — N312 Flaccid neuropathic bladder, not elsewhere classified: Secondary | ICD-10-CM | POA: Diagnosis not present

## 2023-06-20 NOTE — Telephone Encounter (Signed)
Pt called stating that he is needing another copy of the latest readings of his cpap machine. Please advise.

## 2023-06-20 NOTE — Telephone Encounter (Signed)
Noted  

## 2023-06-20 NOTE — Telephone Encounter (Signed)
Pt called and was informed. Pt will come by to pick up today or tomorrow. Pt was very appreciative.

## 2023-06-20 NOTE — Telephone Encounter (Signed)
30 day report copy printed and left at front desk for patient to pick up. Detailed message left on cell.

## 2023-06-23 DIAGNOSIS — R339 Retention of urine, unspecified: Secondary | ICD-10-CM | POA: Diagnosis not present

## 2023-07-13 ENCOUNTER — Encounter: Payer: Self-pay | Admitting: Orthopaedic Surgery

## 2023-07-13 ENCOUNTER — Ambulatory Visit: Payer: No Typology Code available for payment source | Admitting: Orthopaedic Surgery

## 2023-07-13 DIAGNOSIS — M25511 Pain in right shoulder: Secondary | ICD-10-CM

## 2023-07-13 DIAGNOSIS — G8929 Other chronic pain: Secondary | ICD-10-CM | POA: Diagnosis not present

## 2023-07-13 MED ORDER — BUPIVACAINE HCL 0.5 % IJ SOLN
3.0000 mL | INTRAMUSCULAR | Status: AC | PRN
Start: 2023-07-13 — End: 2023-07-13
  Administered 2023-07-13: 3 mL via INTRA_ARTICULAR

## 2023-07-13 MED ORDER — LIDOCAINE HCL 1 % IJ SOLN
3.0000 mL | INTRAMUSCULAR | Status: AC | PRN
Start: 2023-07-13 — End: 2023-07-13
  Administered 2023-07-13: 3 mL

## 2023-07-13 MED ORDER — METHYLPREDNISOLONE ACETATE 40 MG/ML IJ SUSP
40.0000 mg | INTRAMUSCULAR | Status: AC | PRN
Start: 2023-07-13 — End: 2023-07-13
  Administered 2023-07-13: 40 mg via INTRA_ARTICULAR
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
## 2023-08-09 DIAGNOSIS — N451 Epididymitis: Secondary | ICD-10-CM | POA: Diagnosis not present

## 2023-08-09 DIAGNOSIS — N3020 Other chronic cystitis without hematuria: Secondary | ICD-10-CM | POA: Diagnosis not present

## 2023-08-21 DIAGNOSIS — H9122 Sudden idiopathic hearing loss, left ear: Secondary | ICD-10-CM | POA: Diagnosis not present

## 2023-08-30 NOTE — Progress Notes (Unsigned)
Steven Foster
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
## 2023-08-31 ENCOUNTER — Ambulatory Visit: Payer: Medicare Other | Admitting: Family Medicine

## 2023-08-31 ENCOUNTER — Encounter: Payer: Self-pay | Admitting: Family Medicine

## 2023-08-31 VITALS — BP 107/67 | HR 73 | Ht 68.0 in | Wt 170.0 lb

## 2023-08-31 DIAGNOSIS — G4733 Obstructive sleep apnea (adult) (pediatric): Secondary | ICD-10-CM

## 2023-08-31 NOTE — Patient Instructions (Signed)
Please continue using your CPAP regularly. While your insurance requires that you use CPAP at least 4 hours each night on 70% of the nights, I recommend, that you not skip any nights and use it throughout the night if you can. Getting used to CPAP and staying with the treatment long term does take time and patience and discipline. Untreated obstructive sleep apnea when it is moderate to severe can have an adverse impact on cardiovascular health and raise her risk for heart disease, arrhythmias, hypertension, congestive heart failure, stroke and diabetes. Untreated obstructive sleep apnea causes sleep disruption, nonrestorative sleep, and sleep deprivation. This can have an impact on your day to day functioning and cause daytime sleepiness and impairment of cognitive function, memory loss, mood disturbance, and problems focussing. Using CPAP regularly can improve these symptoms.  We will update supply orders, today. I will discuss next steps with Dr Frances Furbish. I would like to do a split night study in our lab. We could titrate the CPAP pressure and consider other modes of therapy if needed.   Follow up pending sleep study

## 2023-08-31 NOTE — Progress Notes (Signed)
Marland Kitchen

## 2023-09-01 NOTE — Addendum Note (Signed)
Addended by: Shawnie Dapper L on: 09/01/2023 07:50 AM   Modules accepted: Orders

## 2023-09-04 ENCOUNTER — Telehealth: Payer: Self-pay | Admitting: Family Medicine

## 2023-09-04 NOTE — Telephone Encounter (Signed)
 [employer] medicare no auth req   I spoke with the patient. He is scheduled at [facility] for 10/09/23 at 8 pm.  Mailed packet to the patient.

## 2023-09-12 ENCOUNTER — Other Ambulatory Visit: Payer: Self-pay | Admitting: Urology

## 2023-09-15 DIAGNOSIS — K219 Gastro-esophageal reflux disease without esophagitis: Secondary | ICD-10-CM | POA: Diagnosis not present

## 2023-09-15 DIAGNOSIS — Z0000 Encounter for general adult medical examination without abnormal findings: Secondary | ICD-10-CM | POA: Diagnosis not present

## 2023-09-15 DIAGNOSIS — E785 Hyperlipidemia, unspecified: Secondary | ICD-10-CM | POA: Diagnosis not present

## 2023-09-20 DIAGNOSIS — L819 Disorder of pigmentation, unspecified: Secondary | ICD-10-CM | POA: Diagnosis not present

## 2023-10-02 DIAGNOSIS — H903 Sensorineural hearing loss, bilateral: Secondary | ICD-10-CM | POA: Diagnosis not present

## 2023-10-09 ENCOUNTER — Ambulatory Visit (INDEPENDENT_AMBULATORY_CARE_PROVIDER_SITE_OTHER): Payer: Medicare Other | Admitting: Neurology

## 2023-10-09 DIAGNOSIS — G4733 Obstructive sleep apnea (adult) (pediatric): Secondary | ICD-10-CM

## 2023-10-09 DIAGNOSIS — G4720 Circadian rhythm sleep disorder, unspecified type: Secondary | ICD-10-CM
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
## 2023-10-12 ENCOUNTER — Other Ambulatory Visit: Payer: Self-pay | Admitting: Family Medicine

## 2023-10-12 DIAGNOSIS — G4733 Obstructive sleep apnea (adult) (pediatric): Secondary | ICD-10-CM

## 2023-10-12 NOTE — Progress Notes (Signed)
 LVM for patient to call back and discuss sleep study results

## 2023-10-16 ENCOUNTER — Telehealth: Payer: Self-pay | Admitting: *Deleted

## 2023-10-16 NOTE — Telephone Encounter (Signed)
-----   Message from Amy Lomax sent at 10/12/2023  9:22 AM EDT ----- Please let him know that his sleep study shows continued sleep apnea with approx 14 apneic events per hour. Worse when in deeper stages of sleep. I am going to order a new machine with a little change in pressure settings. I am hoping this helps correct residual apneic events but if not, we will consider a titration study. I am placing orders for new machine today. Have him follow up 31-90 days following set up. TY!

## 2023-10-16 NOTE — Telephone Encounter (Signed)
 Called and spoke with pt about results per Amy note. Pt verbalized understanding. Community message sent to Steven Foster that orders placed and pt scheduled for initial CPAP f/u for 01/09/24/at 1:30p.

## 2023-10-30 ENCOUNTER — Ambulatory Visit: Payer: Medicare Other | Admitting: Family Medicine

## 2023-11-23 ENCOUNTER — Telehealth: Payer: Self-pay | Admitting: Family Medicine

## 2023-11-23 NOTE — Telephone Encounter (Signed)
 Left message for patient to call.

## 2023-11-23 NOTE — Telephone Encounter (Signed)
 Patient informed with below, pt said he just wanted to get Amy's input and if doesn't wish to increase the pressure at this time.

## 2023-11-23 NOTE — Telephone Encounter (Signed)
 Pt returning call Transfer to POD 1

## 2023-11-23 NOTE — Telephone Encounter (Signed)
 Pt called requesting to speak to a nurse regarding his cpap machine. Pt states that his numbers are not looking good and would like to discuss. Please advise.

## 2023-11-23 NOTE — Telephone Encounter (Signed)
 I called to discuss the below message from phone room he reports his cpap numbers are running lower than usual for the last week, pt said his numbers typically run in the 90's but have been in the 30's  He states his mask if fine and tight on his face, machine is not making any noise. Pt reports he does lay on his back, he is not able to lay on shoulders due pain.  I told him I would run this information by you to get your thoughts.

## 2023-12-04 ENCOUNTER — Telehealth: Payer: Self-pay

## 2023-12-04 NOTE — Telephone Encounter (Signed)
 DOT form faxed and transmission log received

## 2023-12-26 ENCOUNTER — Telehealth: Payer: Self-pay | Admitting: Family Medicine

## 2023-12-26 NOTE — Telephone Encounter (Signed)
 Patient asking if pressure on CPAP machine can be decrease back to when first started on  the machine. Machine is not giving a good reading. They have took my license because of the CPAP machine reading.  Go to [facility] on July 23 to try for license. Would like a call back.

## 2023-12-26 NOTE — Telephone Encounter (Signed)
 I called patient.  He reports that the [facility] would not renew his license.  He is unsure why.  He suspects it could be related to his CPAP.  He is wondering if his pressure on his CPAP needs to be changed.  His AHI is elevated.  He reports that his CPAP is comfortable at the current settings.  He goes back to the [facility] on July 23 to try and renew his license and would like this corrected prior to then.  I advised him that a titration study may be needed per Amy's last note.  Patient will likely call back with Amy's advice.

## 2023-12-27 NOTE — Telephone Encounter (Signed)
 I received a confirmation that order was received in March 2025. I sent a message to [employer] asking them to re-look at this order and correct as directed.

## 2024-01-02 ENCOUNTER — Telehealth: Payer: Self-pay | Admitting: Physical Therapy

## 2024-01-02 NOTE — Telephone Encounter (Signed)
 Had SA shoulder injection 12/24.

## 2024-01-02 NOTE — Telephone Encounter (Signed)
 Pt left a VM on the PT line stating he received a call about his injection scheduled for 01/02/24.  Requested a call back. [phone]

## 2024-01-02 NOTE — Telephone Encounter (Signed)
 Tried to call patient. No answer. LMOM that he can call if he still has questions.

## 2024-01-03 ENCOUNTER — Ambulatory Visit (INDEPENDENT_AMBULATORY_CARE_PROVIDER_SITE_OTHER): Payer: Self-pay | Admitting: Orthopaedic Surgery

## 2024-01-03 DIAGNOSIS — M25511 Pain in right shoulder: Secondary | ICD-10-CM | POA: Diagnosis not present

## 2024-01-03 DIAGNOSIS — G8929 Other chronic pain: Secondary | ICD-10-CM

## 2024-01-03 MED ORDER — LIDOCAINE HCL 1 % IJ SOLN
3.0000 mL | INTRAMUSCULAR | Status: AC | PRN
  Administered 2024-01-03: 3 mL

## 2024-01-03 MED ORDER — METHYLPREDNISOLONE ACETATE 40 MG/ML IJ SUSP
40.0000 mg | INTRAMUSCULAR | Status: AC | PRN
  Administered 2024-01-03: 40 mg via INTRA_ARTICULAR

## 2024-01-03 MED ORDER — BUPIVACAINE HCL 0.5 % IJ SOLN
3.0000 mL | INTRAMUSCULAR | Status: AC | PRN
Start: 2024-01-03 — End: 2024-01-03
  Administered 2024-01-03: 3 mL via INTRA_ARTICULAR
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
## 2024-01-08 ENCOUNTER — Telehealth: Payer: Self-pay | Admitting: Family Medicine

## 2024-01-08 NOTE — Patient Instructions (Incomplete)

## 2024-01-08 NOTE — Telephone Encounter (Signed)
Patient called to verify appointment 

## 2024-01-08 NOTE — Progress Notes (Unsigned)
 SABRA
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
## 2024-01-09 ENCOUNTER — Telehealth: Payer: Self-pay | Admitting: Family Medicine

## 2024-01-09 ENCOUNTER — Encounter: Payer: Self-pay | Admitting: Family Medicine

## 2024-01-09 ENCOUNTER — Ambulatory Visit: Admitting: Family Medicine

## 2024-01-09 VITALS — BP 107/71 | HR 71 | Ht 68.0 in | Wt 163.5 lb

## 2024-01-09 DIAGNOSIS — G4733 Obstructive sleep apnea (adult) (pediatric): Secondary | ICD-10-CM | POA: Diagnosis not present

## 2024-01-09 NOTE — Telephone Encounter (Signed)
 Called pt. Relayed below info. He will be on look out for phone call from [facility]. He will call once he gets set up with new CPAP to schedule initial CPAP f/u on new machine.

## 2024-01-09 NOTE — Telephone Encounter (Signed)
 Pt has called , asking it be noted that he just spoke with DME. They informed him that Amy, NP needs to send a fax requesting a new CPAP for him, please call pt to discuss.

## 2024-01-09 NOTE — Telephone Encounter (Signed)
 Jennifer sent community message to [facility] to f/u with pt. They had plans to pull order to get him set up with new CPAP 01/15/24. They have order already.

## 2024-01-25 DIAGNOSIS — E785 Hyperlipidemia, unspecified: Secondary | ICD-10-CM | POA: Diagnosis not present

## 2024-01-25 DIAGNOSIS — S069XAA Unspecified intracranial injury with loss of consciousness status unknown, initial encounter: Secondary | ICD-10-CM | POA: Diagnosis not present

## 2024-01-25 DIAGNOSIS — G4730 Sleep apnea, unspecified: Secondary | ICD-10-CM | POA: Diagnosis not present

## 2024-01-25 DIAGNOSIS — R413 Other amnesia: Secondary | ICD-10-CM | POA: Diagnosis not present

## 2024-01-25 DIAGNOSIS — L219 Seborrheic dermatitis, unspecified: Secondary | ICD-10-CM | POA: Diagnosis not present

## 2024-01-25 DIAGNOSIS — R770 Abnormality of albumin: Secondary | ICD-10-CM | POA: Diagnosis not present

## 2024-01-25 DIAGNOSIS — M2550 Pain in unspecified joint: Secondary | ICD-10-CM | POA: Diagnosis not present

## 2024-01-25 DIAGNOSIS — K219 Gastro-esophageal reflux disease without esophagitis: Secondary | ICD-10-CM | POA: Diagnosis not present

## 2024-01-25 DIAGNOSIS — Z139 Encounter for screening, unspecified: Secondary | ICD-10-CM | POA: Diagnosis not present

## 2024-01-26 DIAGNOSIS — G4733 Obstructive sleep apnea (adult) (pediatric): Secondary | ICD-10-CM | POA: Diagnosis not present

## 2024-02-26 DIAGNOSIS — G4733 Obstructive sleep apnea (adult) (pediatric): Secondary | ICD-10-CM | POA: Diagnosis not present

## 2024-03-15 DIAGNOSIS — L219 Seborrheic dermatitis, unspecified: Secondary | ICD-10-CM | POA: Diagnosis not present

## 2024-03-21 DIAGNOSIS — R197 Diarrhea, unspecified: Secondary | ICD-10-CM | POA: Diagnosis not present

## 2024-03-25 DIAGNOSIS — R197 Diarrhea, unspecified: Secondary | ICD-10-CM | POA: Diagnosis not present

## 2024-04-11 ENCOUNTER — Encounter: Payer: Self-pay | Admitting: Family Medicine

## 2024-04-27 DIAGNOSIS — G4733 Obstructive sleep apnea (adult) (pediatric): Secondary | ICD-10-CM | POA: Diagnosis not present

## 2024-05-01 DIAGNOSIS — N3020 Other chronic cystitis without hematuria: Secondary | ICD-10-CM | POA: Diagnosis not present

## 2024-05-01 DIAGNOSIS — R3914 Feeling of incomplete bladder emptying: Secondary | ICD-10-CM | POA: Diagnosis not present

## 2024-07-04 ENCOUNTER — Ambulatory Visit: Admitting: Orthopaedic Surgery

## 2024-08-16 ENCOUNTER — Other Ambulatory Visit: Payer: Self-pay | Admitting: Nurse Practitioner

## 2024-08-16 ENCOUNTER — Ambulatory Visit
Admission: RE | Admit: 2024-08-16 | Discharge: 2024-08-16 | Disposition: A | Source: Ambulatory Visit | Attending: Nurse Practitioner | Admitting: Nurse Practitioner

## 2024-08-16 DIAGNOSIS — M79672 Pain in left foot: Secondary | ICD-10-CM

## 2025-01-07 ENCOUNTER — Ambulatory Visit: Admitting: Family Medicine

## 2025-01-08 ENCOUNTER — Ambulatory Visit: Admitting: Family Medicine
